# Patient Record
Sex: Female | Born: 1975
Health system: Southern US, Community
[De-identification: ages and names within clinical notes are randomized; demographics above are authoritative.]

## PROBLEM LIST (undated history)

## (undated) DIAGNOSIS — I10 Essential (primary) hypertension: Secondary | ICD-10-CM

## (undated) DIAGNOSIS — K219 Gastro-esophageal reflux disease without esophagitis: Secondary | ICD-10-CM

## (undated) DIAGNOSIS — F419 Anxiety disorder, unspecified: Secondary | ICD-10-CM

## (undated) HISTORY — DX: Anxiety disorder, unspecified: F41.9

## (undated) HISTORY — PX: REDUCTION MAMMAPLASTY: SUR839

## (undated) HISTORY — PX: BREAST REDUCTION SURGERY: SHX8

## (undated) HISTORY — PX: ABDOMINAL HYSTERECTOMY: SHX81

## (undated) HISTORY — DX: Gastro-esophageal reflux disease without esophagitis: K21.9

---

## 2004-09-01 HISTORY — PX: TUBAL LIGATION: SHX77

## 2004-10-03 ENCOUNTER — Ambulatory Visit: Payer: Self-pay | Admitting: Obstetrics & Gynecology

## 2004-12-19 ENCOUNTER — Inpatient Hospital Stay: Payer: Self-pay | Admitting: Unknown Physician Specialty

## 2007-03-04 ENCOUNTER — Ambulatory Visit: Payer: Self-pay

## 2007-08-02 ENCOUNTER — Ambulatory Visit: Payer: Self-pay | Admitting: General Surgery

## 2008-03-07 ENCOUNTER — Ambulatory Visit: Payer: Self-pay | Admitting: General Surgery

## 2009-05-02 ENCOUNTER — Ambulatory Visit: Payer: Self-pay | Admitting: General Surgery

## 2010-09-01 HISTORY — PX: BREAST SURGERY: SHX581

## 2011-09-02 HISTORY — PX: ABLATION: SHX5711

## 2012-12-02 ENCOUNTER — Ambulatory Visit: Payer: Self-pay | Admitting: Obstetrics & Gynecology

## 2012-12-02 LAB — CBC
HCT: 39.2 % (ref 35.0–47.0)
HGB: 12.8 g/dL (ref 12.0–16.0)
MCH: 27.6 pg (ref 26.0–34.0)
MCHC: 32.5 g/dL (ref 32.0–36.0)
MCV: 85 fL (ref 80–100)
Platelet: 251 10*3/uL (ref 150–440)
RBC: 4.63 10*6/uL (ref 3.80–5.20)
RDW: 13.6 % (ref 11.5–14.5)
WBC: 8.7 10*3/uL (ref 3.6–11.0)

## 2012-12-02 LAB — PREGNANCY, URINE: Pregnancy Test, Urine: NEGATIVE m[IU]/mL

## 2012-12-10 ENCOUNTER — Ambulatory Visit: Payer: Self-pay | Admitting: Obstetrics & Gynecology

## 2012-12-13 LAB — PATHOLOGY REPORT

## 2014-12-22 NOTE — Op Note (Signed)
PATIENT NAME:  Amy Day, KUBLY B MR#:  [id number] DATE OF BIRTH:  01-Dec-1975  DATE OF PROCEDURE:  12/10/2012  PREOPERATIVE DIAGNOSIS: Menorrhagia.   POSTOPERATIVE DIAGNOSIS: Menorrhagia.   PROCEDURE: 1.  Hysteroscopy. 2.  Dilation and curettage.   3.  NovaSure endometrial ablation general.   ANESTHESIA:  General.  SURGEON:  Nathan Moctezuma K. Thurmond Butts,  MD  ESTIMATED BLOOD LOSS: Minimal.   IV FLUIDS: 700 mL crystalloid.   URINE OUTPUT: 50 mL prior to procedure.   COMPLICATIONS: None.   FINDINGS: Small anteverted uterus, normal endometrium without defects, fibroids or polyps.  Uterus sounded to 9 cm, cervical length 5 cm, uterine cavity length 4 cm and width 3.8 cm NovaSure power 84 and run time 1 minute and 54 seconds.   SPECIMEN: Endometrial curettings.   PROCEDURE IN DETAIL: The patient was taken to the operating room where she was given general anesthesia without complication. She had her legs placed in candy cane stirrups. She was prepped and draped in standard sterile fashion. After a brief time now, speculum was inserted into the vagina and the cervix was visualized and grasped anteriorly with a single-tooth tenaculum. The uterus was then sounded. Next serially dilated up to 23 [place] and the hysteroscope was inserted into the uterus with findings noted above. Hysteroscope was then removed and sharp curettage was performed with acquisition of endometrial curettings which were sent for pathology. After full curettage with good uterine cry noted, the NovaSure was inserted into the uterus without difficulty with the settings noted above. A test was done and no complications detected though so the procedure was run for a total of 1 minute and 54 seconds. The NovaSure was then removed and noted to be intact. Next, the hysteroscope was again inserted one last time with visualization of the entire uterine cavity with good ablative visualization. 100% of the cavity had been ablated.  The  hysteroscope was removed, the single-tooth tenaculum removed. Hemostasis was noted to be excellent.  The speculum was removed. The patient tolerated the procedure well and was taken to the recovery room in stable condition.     ____________________________ Elinor Parkinson Thurmond Butts, MD eks:ct D: 12/10/2012 09:34:27 ET T: 12/10/2012 10:18:02 ET JOB#: [id number]  cc: Aron Baba K. Thurmond Butts, MD, <Dictator> Aron Baba Raeford Razor CLIPP MD ELECTRONICALLY SIGNED 12/11/2012 8:25

## 2015-06-05 ENCOUNTER — Other Ambulatory Visit: Payer: Self-pay | Admitting: Nurse Practitioner

## 2015-06-05 DIAGNOSIS — Z0189 Encounter for other specified special examinations: Secondary | ICD-10-CM

## 2015-06-05 DIAGNOSIS — Z1321 Encounter for screening for nutritional disorder: Secondary | ICD-10-CM

## 2015-06-05 DIAGNOSIS — E785 Hyperlipidemia, unspecified: Secondary | ICD-10-CM

## 2015-06-07 ENCOUNTER — Other Ambulatory Visit: Payer: Self-pay | Admitting: Nurse Practitioner

## 2015-06-07 ENCOUNTER — Other Ambulatory Visit: Payer: Self-pay | Admitting: *Deleted

## 2015-06-07 DIAGNOSIS — E785 Hyperlipidemia, unspecified: Secondary | ICD-10-CM

## 2015-06-07 DIAGNOSIS — Z1321 Encounter for screening for nutritional disorder: Secondary | ICD-10-CM

## 2015-06-07 DIAGNOSIS — Z0189 Encounter for other specified special examinations: Secondary | ICD-10-CM

## 2015-06-07 LAB — COMPREHENSIVE METABOLIC PANEL
ALT: 18 U/L (ref 0–35)
AST: 19 U/L (ref 0–37)
Albumin: 4 g/dL (ref 3.5–5.2)
Alkaline Phosphatase: 88 U/L (ref 39–117)
BUN: 14 mg/dL (ref 6–23)
CO2: 25 mEq/L (ref 19–32)
Calcium: 9.4 mg/dL (ref 8.4–10.5)
Chloride: 106 mEq/L (ref 96–112)
Creatinine, Ser: 0.83 mg/dL (ref 0.40–1.20)
GFR: 81.41 mL/min (ref >60.00)
Glucose, Bld: 87 mg/dL (ref 70–99)
Potassium: 4 mEq/L (ref 3.5–5.1)
Sodium: 138 mEq/L (ref 135–145)
Total Bilirubin: 0.5 mg/dL (ref 0.2–1.2)
Total Protein: 6.9 g/dL (ref 6.0–8.3)

## 2015-06-07 LAB — VITAMIN D 25 HYDROXY (VIT D DEFICIENCY, FRACTURES): VITD: 32.65 ng/mL (ref 30.00–100.00)

## 2015-06-08 ENCOUNTER — Telehealth: Payer: Self-pay | Admitting: *Deleted

## 2015-06-08 NOTE — Telephone Encounter (Signed)
Sherry from [facility] called saying the no longer do the NMR, lipoprofile but they have a very similar test called: Cardio IQ advance lipid panel is it ok to due? Please let me know

## 2015-06-08 NOTE — Telephone Encounter (Signed)
Sure

## 2015-06-11 LAB — CARDIO IQ(R) ADVANCED LIPID PANEL
Apolipoprotein B: 96 mg/dL (ref 49–103)
Cholesterol, Total: 185 mg/dL (ref 125–200)
Cholesterol/HDL Ratio: 3.1 calc (ref <=5.0)
HDL Cholesterol: 59 mg/dL (ref >=46)
LDL Large: 4594 nmol/L — ABNORMAL LOW (ref 5038–17886)
LDL Medium: 265 nmol/L (ref 121–397)
LDL Particle Number: 1283 nmol/L (ref 1016–2185)
LDL Peak Size: 221.7 Angstrom (ref >=218.2)
LDL Small: 194 nmol/L (ref 115–386)
LDL, Calculated: 109 mg/dL
Lipoprotein (a): 289 nmol/L — ABNORMAL HIGH (ref <75)
Non-HDL Cholesterol: 126 mg/dL
Triglycerides: 85 mg/dL

## 2015-06-11 LAB — HM PAP SMEAR: HM Pap smear: NORMAL

## 2015-06-12 ENCOUNTER — Encounter: Payer: Self-pay | Admitting: Nurse Practitioner

## 2015-06-27 ENCOUNTER — Encounter: Payer: Self-pay | Admitting: Nurse Practitioner

## 2015-06-27 ENCOUNTER — Ambulatory Visit (INDEPENDENT_AMBULATORY_CARE_PROVIDER_SITE_OTHER): Payer: 59 | Admitting: Nurse Practitioner

## 2015-06-27 VITALS — BP 114/80 | HR 80 | Temp 98.3°F | Resp 14 | Ht 67.0 in | Wt 215.2 lb

## 2015-06-27 DIAGNOSIS — I10 Essential (primary) hypertension: Secondary | ICD-10-CM | POA: Diagnosis not present

## 2015-06-27 DIAGNOSIS — E66811 Obesity, class 1: Secondary | ICD-10-CM | POA: Insufficient documentation

## 2015-06-27 DIAGNOSIS — K219 Gastro-esophageal reflux disease without esophagitis: Secondary | ICD-10-CM | POA: Diagnosis not present

## 2015-06-27 DIAGNOSIS — E66812 Obesity, class 2: Secondary | ICD-10-CM | POA: Insufficient documentation

## 2015-06-27 DIAGNOSIS — Z7689 Persons encountering health services in other specified circumstances: Secondary | ICD-10-CM | POA: Insufficient documentation

## 2015-06-27 DIAGNOSIS — Z79899 Other long term (current) drug therapy: Secondary | ICD-10-CM | POA: Insufficient documentation

## 2015-06-27 DIAGNOSIS — E669 Obesity, unspecified: Secondary | ICD-10-CM

## 2015-06-27 DIAGNOSIS — Z1159 Encounter for screening for other viral diseases: Secondary | ICD-10-CM | POA: Insufficient documentation

## 2015-06-27 MED ORDER — INSULIN PEN NEEDLE 31G X 5 MM MISC: 0.6000 mg | Freq: Every day | Status: DC

## 2015-06-27 MED ORDER — LIRAGLUTIDE -WEIGHT MANAGEMENT 18 MG/3ML ~~LOC~~ SOPN: 0.6000 mg | PEN_INJECTOR | Freq: Every day | SUBCUTANEOUS | Status: DC

## 2015-06-27 NOTE — Assessment & Plan Note (Signed)
Pt has food triggers and uses Prilosec daily.

## 2015-06-27 NOTE — Progress Notes (Signed)
Pre visit review using our clinic review tool, if applicable. No additional management support is needed unless otherwise documented below in the visit note. 

## 2015-06-27 NOTE — Assessment & Plan Note (Signed)
Discussed acute and chronic issues. Reviewed health maintenance measures, PFSHx, and immunizations. Obtain records.   

## 2015-06-27 NOTE — Assessment & Plan Note (Signed)
>>  ASSESSMENT AND PLAN FOR OBESITY WRITTEN ON 06/27/2015  3:44 PM BY DOSS, CARRIE M, NP  Wt Readings from Last 3 Encounters:  06/27/15 215 lb 3.2 oz (97.614 kg)   Pt would like to try Saxenda .  She has failed phentermine and weight watchers in the past.  Follow up when she has the device.

## 2015-06-27 NOTE — Progress Notes (Signed)
Patient ID: Amy Day, female    DOB: [birth year]  Age: 39 y.o. MRN: [id number]  CC: Establish Care   HPI Amy Day presents for establishing care today.   1) New pt info:   Immunizations- UTD   Mammogram- Not of age, but has grandmother dx approx age 68   Pap- 78/2956 Amy Day for GYN care   Eye Exam- Not UTD  Dental- UTD  LMP- 4 years ago   2) Chronic Problems-   GERD- watches triggers, prilosec  HTN- Cozaar   Ibuprofen use often   3) Acute Problems-  Weight loss- phentermine helped the first time, lost a good amount of weight did not help the second time (lost 3 lbs). Weight watchers helped to a certain point, but once she stops the lifestyle changes she gained back steadily.   History Amy Day has a past medical history of GERD (gastroesophageal reflux disease).   She has past surgical history that includes Tubal ligation (2006); Breast surgery (2012); and Ablation (2013).   Her family history includes COPD in her paternal grandmother; Diabetes in her father.She reports that she quit smoking about 11 years ago. She does not have any smokeless tobacco history on file. She reports that she drinks alcohol. She reports that she does not use illicit drugs.  No outpatient prescriptions prior to visit.   No facility-administered medications prior to visit.    ROS Review of Systems  Constitutional: Negative for fever, chills, diaphoresis, activity change, appetite change, fatigue and unexpected weight change.  Respiratory: Negative for chest tightness, shortness of breath and wheezing.   Cardiovascular: Negative for chest pain, palpitations and leg swelling.  Gastrointestinal: Negative for nausea, vomiting and diarrhea.  Skin: Negative for rash.  Neurological: Negative for dizziness, weakness, numbness and headaches.  Psychiatric/Behavioral: Negative for suicidal ideas and sleep disturbance. The patient is not nervous/anxious.     Objective:  BP 114/80 mmHg   Pulse 80  Temp(Src) 98.3 F (36.8 C)  Resp 14  Ht 5\' 7"  (1.702 m)  Wt 215 lb 3.2 oz (97.614 kg)  BMI 33.70 kg/m2  SpO2 97%  Physical Exam  Constitutional: She is oriented to person, place, and time. She appears well-developed and well-nourished. No distress.  HENT:  Head: Normocephalic and atraumatic.  Right Ear: External ear normal.  Left Ear: External ear normal.  Neurological: She is alert and oriented to person, place, and time. No cranial nerve deficit. She exhibits normal muscle tone. Coordination normal.  Skin: Skin is warm and dry. No rash noted. She is not diaphoretic.  Psychiatric: She has a normal mood and affect. Her behavior is normal. Judgment and thought content normal.   Assessment & Plan:   Amy Day was seen today for establish care.  Diagnoses and all orders for this visit:  Encounter to establish care  Obesity -     Liraglutide -Weight Management (SAXENDA) 18 MG/3ML SOPN; Inject 0.6 mg into the skin daily.  I am having Amy Day start on Liraglutide -Weight Management. I am also having her maintain her losartan and omeprazole.  Meds ordered this encounter  Medications  . losartan (COZAAR) 50 MG tablet    Sig: Take 1 tablet by mouth daily.  Marland Kitchen omeprazole (PRILOSEC) 40 MG capsule    Sig: Take 1 capsule by mouth daily.  . Liraglutide -Weight Management (SAXENDA) 18 MG/3ML SOPN    Sig: Inject 0.6 mg into the skin daily.    Dispense:  3 mL    Refill:  0  Order Specific Question:  Supervising Provider    Answer:  Amy Day [[id number]]     Follow-up: No Follow-up on file.

## 2015-06-27 NOTE — Assessment & Plan Note (Signed)
BP Readings from Last 3 Encounters:  06/27/15 114/80   Pt stable on Cozaar.

## 2015-06-27 NOTE — Assessment & Plan Note (Addendum)
Wt Readings from Last 3 Encounters:  06/27/15 215 lb 3.2 oz (97.614 kg)   Pt would like to try Saxenda.  She has failed phentermine and weight watchers in the past.  Follow up when she has the device.

## 2015-06-28 ENCOUNTER — Telehealth: Payer: Self-pay

## 2015-06-28 NOTE — Telephone Encounter (Signed)
Started PA process in cover my meds for Saxenda.

## 2015-07-02 ENCOUNTER — Ambulatory Visit: Payer: Self-pay | Admitting: Nurse Practitioner

## 2015-07-04 NOTE — Telephone Encounter (Signed)
Message from cover my meds, drug approved.

## 2015-07-06 ENCOUNTER — Other Ambulatory Visit: Payer: Self-pay | Admitting: Nurse Practitioner

## 2015-07-06 MED ORDER — HYDROCOD POLST-CPM POLST ER 10-8 MG/5ML PO SUER: 5.0000 mL | Freq: Every evening | ORAL | Status: DC | PRN

## 2015-08-14 ENCOUNTER — Other Ambulatory Visit: Payer: Self-pay | Admitting: Nurse Practitioner

## 2015-08-14 DIAGNOSIS — E669 Obesity, unspecified: Secondary | ICD-10-CM

## 2015-08-14 MED ORDER — LIRAGLUTIDE -WEIGHT MANAGEMENT 18 MG/3ML ~~LOC~~ SOPN: 0.6000 mg | PEN_INJECTOR | Freq: Every day | SUBCUTANEOUS | Status: DC

## 2015-10-05 NOTE — Telephone Encounter (Signed)
Submitted for re authorization of saxenda on Cover my meds, 10/05/15

## 2015-10-17 ENCOUNTER — Telehealth: Payer: Self-pay

## 2015-10-17 DIAGNOSIS — R58 Hemorrhage, not elsewhere classified: Secondary | ICD-10-CM | POA: Diagnosis not present

## 2015-10-17 DIAGNOSIS — N809 Endometriosis, unspecified: Secondary | ICD-10-CM | POA: Diagnosis not present

## 2015-10-17 NOTE — Telephone Encounter (Signed)
Re approval for Saxenda completed, approved from 10/19/2015-10/18/2016.

## 2015-11-06 DIAGNOSIS — R102 Pelvic and perineal pain: Secondary | ICD-10-CM | POA: Diagnosis not present

## 2015-11-19 DIAGNOSIS — R1031 Right lower quadrant pain: Secondary | ICD-10-CM | POA: Diagnosis not present

## 2015-11-19 DIAGNOSIS — R102 Pelvic and perineal pain: Secondary | ICD-10-CM | POA: Diagnosis not present

## 2015-12-11 ENCOUNTER — Ambulatory Visit (INDEPENDENT_AMBULATORY_CARE_PROVIDER_SITE_OTHER): Payer: 59 | Admitting: Family Medicine

## 2015-12-11 ENCOUNTER — Encounter: Payer: Self-pay | Admitting: Family Medicine

## 2015-12-11 VITALS — BP 136/90 | HR 91 | Temp 98.2°F | Ht 67.0 in | Wt 215.0 lb

## 2015-12-11 DIAGNOSIS — R002 Palpitations: Secondary | ICD-10-CM | POA: Diagnosis not present

## 2015-12-11 LAB — COMPREHENSIVE METABOLIC PANEL
ALT: 12 U/L (ref 0–35)
AST: 12 U/L (ref 0–37)
Albumin: 4.2 g/dL (ref 3.5–5.2)
Alkaline Phosphatase: 76 U/L (ref 39–117)
BUN: 12 mg/dL (ref 6–23)
CO2: 23 mEq/L (ref 19–32)
Calcium: 9.3 mg/dL (ref 8.4–10.5)
Chloride: 107 mEq/L (ref 96–112)
Creatinine, Ser: 0.91 mg/dL (ref 0.40–1.20)
GFR: 73.02 mL/min (ref >60.00)
Glucose, Bld: 129 mg/dL — ABNORMAL HIGH (ref 70–99)
Potassium: 4 mEq/L (ref 3.5–5.1)
Sodium: 141 mEq/L (ref 135–145)
Total Bilirubin: 0.4 mg/dL (ref 0.2–1.2)
Total Protein: 6.9 g/dL (ref 6.0–8.3)

## 2015-12-11 LAB — CBC
HCT: 43.3 % (ref 36.0–46.0)
Hemoglobin: 14.8 g/dL (ref 12.0–15.0)
MCHC: 34.1 g/dL (ref 30.0–36.0)
MCV: 91.3 fl (ref 78.0–100.0)
Platelets: 323 10*3/uL (ref 150.0–400.0)
RBC: 4.74 Mil/uL (ref 3.87–5.11)
RDW: 12.6 % (ref 11.5–15.5)
WBC: 8.3 10*3/uL (ref 4.0–10.5)

## 2015-12-11 LAB — TSH: TSH: 1.66 u[IU]/mL (ref 0.35–4.50)

## 2015-12-11 NOTE — Assessment & Plan Note (Signed)
Patient presents with increased frequency and palpitations. EKG is reassuring today. No current symptoms. No chest pain or shortness of breath with this. Vital signs are reassuring. Blood pressure is slightly elevated though patient reports she has not been taking her blood pressure medication. She reports it is in the "normal range" at home. Advised that she should continue to monitor blood pressure. If greater than 140/90 she needs to let us know so we can restart her blood pressure medication. We'll check lab work as outlined below to evaluate her palpitations. We will refer her to cardiology for further evaluation given increased frequency. She is given return precautions.

## 2015-12-11 NOTE — Patient Instructions (Signed)
Nice to see you. We're going to check some lab work to evaluate your palpitations. We are going to refer you to cardiology for further evaluation of this. Please monitor your blood pressure at home. If it is consistently greater than 140/90 please let us know so we can restart your blood pressure medication. If you develop persistent palpitations, or develop chest pain, shortness of breath, sweating, or any new or changing symptoms please seek medical attention.

## 2015-12-11 NOTE — Progress Notes (Signed)
Patient ID: [name], female   DOB: 07/26/1976, 40 y.o.   MRN: [id number]  Amy Rumps, MD Phone: [phone]  Amy Day is a 40 y.o. female who presents today for same-day visit.  Palpitations: Patient notes over the last several days she has felt like her heart has intermittently been racing. Feels as though it is startled and flutters for a couple of seconds and then improves. She has had this in the past though not as frequently. Is occurring up to 6 times per day. She denies chest pain and shortness of breath with this. No history of thyroid dysfunction, anemia, or electrolyte abnormalities. She has not taking her blood pressure medicine. She denies excessive intake of caffeine. And no new medications.  PMH: Former smoker   ROS see history of present illness  Objective  Physical Exam Filed Vitals:   12/11/15 0906 12/11/15 0920  BP: 144/92 136/90  Pulse: 91   Temp: 98.2 F (36.8 C)     BP Readings from Last 3 Encounters:  12/11/15 136/90  06/27/15 114/80   Wt Readings from Last 3 Encounters:  12/11/15 215 lb (97.523 kg)  06/27/15 215 lb 3.2 oz (97.614 kg)    Physical Exam  Constitutional: She is well-developed, well-nourished, and in no distress.  HENT:  Head: Normocephalic and atraumatic.  Neck: Neck supple. No thyromegaly present.  Cardiovascular: Normal rate, regular rhythm and normal heart sounds.  Exam reveals no gallop and no friction rub.   No murmur heard. Pulmonary/Chest: Effort normal and breath sounds normal. No respiratory distress. She has no wheezes. She has no rales.  Musculoskeletal: She exhibits no edema.  Neurological: She is alert. Gait normal.  Skin: Skin is warm and dry. She is not diaphoretic.   EKG: Normal sinus rhythm, rate 86, no ST or T-wave changes  Assessment/Plan: Please see individual problem list.  Palpitations Patient presents with increased frequency and palpitations. EKG is reassuring today. No current symptoms. No  chest pain or shortness of breath with this. Vital signs are reassuring. Blood pressure is slightly elevated though patient reports she has not been taking her blood pressure medication. She reports it is in the "normal range" at home. Advised that she should continue to monitor blood pressure. If greater than 140/90 she needs to let us know so we can restart her blood pressure medication. We'll check lab work as outlined below to evaluate her palpitations. We will refer her to cardiology for further evaluation given increased frequency. She is given return precautions.    Orders Placed This Encounter  Procedures  . CBC  . TSH  . Comp Met (CMET)  . Ambulatory referral to Cardiology    Referral Priority:  Routine    Referral Type:  Consultation    Referral Reason:  Specialty Services Required    Requested Specialty:  Cardiology    Number of Visits Requested:  1  . EKG 12-Lead    Amy Rumps, MD [place]

## 2015-12-11 NOTE — Progress Notes (Signed)
Pre visit review using our clinic review tool, if applicable. No additional management support is needed unless otherwise documented below in the visit note. 

## 2015-12-12 ENCOUNTER — Ambulatory Visit (INDEPENDENT_AMBULATORY_CARE_PROVIDER_SITE_OTHER): Payer: 59 | Admitting: Cardiology

## 2015-12-12 ENCOUNTER — Encounter: Payer: Self-pay | Admitting: Cardiology

## 2015-12-12 VITALS — BP 142/98 | HR 80 | Ht 66.0 in | Wt 211.5 lb

## 2015-12-12 DIAGNOSIS — R002 Palpitations: Secondary | ICD-10-CM | POA: Diagnosis not present

## 2015-12-12 DIAGNOSIS — R030 Elevated blood-pressure reading, without diagnosis of hypertension: Secondary | ICD-10-CM

## 2015-12-12 DIAGNOSIS — IMO0001 Reserved for inherently not codable concepts without codable children: Secondary | ICD-10-CM

## 2015-12-12 NOTE — Progress Notes (Signed)
Cardiology Office Note   Date:  12/12/2015   ID:  Amy Day, DOB 23-Feb-1976, MRN [id number]  Referring Doctor:  Rubbie Battiest, NP   Cardiologist:   Wende Bushy, MD   Reason for consultation:  Chief Complaint  Patient presents with  . other    Ref by Dr. Flonnie Overman for palpitations. Meds reviewed by the patient verbally.       History of Present Illness: Amy Day is a 40 y.o. female who presents for Palpitations. Episodes started over the weekend, approximately 4 days ago. Palpitations are described as fluttering/pounding in the chest. Patient feels that her heart beats or more noticeable. She doesn't feel them racing. Symptoms are located mainly in the chest, nonradiating. Mild to moderate in severity, lasting a few seconds at a time. Episodes occur throughout the day, randomly occurring, not associated with anything in particular, not more noticeable with exertion. May be more noticeable at rest. No associated chest pain or shortness of breath. She does not recall anything in particular that's changed in her routine over the weekend. No excessive caffeine intake.  Patient denies headache, fever, cough, colds, shortness of breath, abdominal pain, chest pain PND, orthopnea, edema.   ROS:  Please see the history of present illness. Aside from mentioned under HPI, all other systems are reviewed and negative.     Past Medical History  Diagnosis Date  . GERD (gastroesophageal reflux disease)     Past Surgical History  Procedure Laterality Date  . Tubal ligation  2006  . Breast surgery  2012    Breast Reduction  . Ablation  2013     reports that she quit smoking about 11 years ago. She does not have any smokeless tobacco history on file. She reports that she drinks alcohol. She reports that she does not use illicit drugs. Patient uses electronic cigarettes/vaping  family history includes COPD in her paternal grandmother; Diabetes in her father.  Father had MI at age  39. Paternal grandmother had "angina".  Current Outpatient Prescriptions  Medication Sig Dispense Refill  . norethindrone (AYGESTIN) 5 MG tablet Take 15 mg by mouth daily.    Marland Kitchen omeprazole (PRILOSEC) 40 MG capsule Take 1 capsule by mouth daily.     No current facility-administered medications for this visit.    Allergies: Review of patient's allergies indicates no known allergies.    PHYSICAL EXAM: VS:  BP 142/98 mmHg  Pulse 80  Ht 5\' 6"  (1.676 m)  Wt 211 lb 8 oz (95.936 kg)  BMI 34.15 kg/m2  SpO2 98% , Body mass index is 34.15 kg/(m^2). Wt Readings from Last 3 Encounters:  12/12/15 211 lb 8 oz (95.936 kg)  12/11/15 215 lb (97.523 kg)  06/27/15 215 lb 3.2 oz (97.614 kg)    GENERAL:  well developed, well nourished, obese, not in acute distress HEENT: normocephalic, pink conjunctivae, anicteric sclerae, no xanthelasma, normal dentition, oropharynx clear NECK:  no neck vein engorgement, JVP normal, no hepatojugular reflux, carotid upstroke brisk and symmetric, no bruit, no thyromegaly, no lymphadenopathy LUNGS:  good respiratory effort, clear to auscultation bilaterally CV:  PMI not displaced, no thrills, no lifts, S1 and S2 within normal limits, no palpable S3 or S4, no murmurs, no rubs, no gallops ABD:  Soft, nontender, nondistended, normoactive bowel sounds, no abdominal aortic bruit, no hepatomegaly, no splenomegaly MS: nontender back, no kyphosis, no scoliosis, no joint deformities EXT:  2+ DP/PT pulses, no edema, no varicosities, no cyanosis, no clubbing SKIN: warm,  nondiaphoretic, normal turgor, no ulcers NEUROPSYCH: alert, oriented to person, place, and time, sensory/motor grossly intact, normal mood, appropriate affect  Recent Labs: 12/11/2015: ALT 12; BUN 12; Creatinine, Ser 0.91; Hemoglobin 14.8; Platelets 323.0; Potassium 4.0; Sodium 141; TSH 1.66   Lipid Panel    Component Value Date/Time   CHOL 185 06/07/2015 0821   TRIG 85 06/07/2015 0821   HDL 59 06/07/2015 0821    CHOLHDL 3.1 06/07/2015 0821   LDLCALC 109 06/07/2015 0821     Other studies Reviewed:  EKG:  The ekg from 12/12/2015 was personally reviewed by me and it revealed sinus rhythm, 80 BPM.  Additional studies/ records that were reviewed personally reviewed by me today include: None available   ASSESSMENT AND PLAN:  Palpitations Recommend 24-hour Holter monitor, and echocardiogram for further evaluation.  Elevated blood pressure measurement Patient reports that she used to take losartan for diagnosis of hypertension. She stopped taking this last 6 months since she noted her blood pressure to be within normal limits. Recommend blood pressure log and to check with PCP regarding this. She may need to go back on her antihypertensive medication.  Advised patient to check with prescribing M.D. regarding use of OCP and e-garettes/vaping.   Current medicines are reviewed at length with the patient today.  The patient does not have concerns regarding medicines.  Labs/ tests ordered today include:  Orders Placed This Encounter  Procedures  . Holter monitor - 24 hour  . EKG 12-Lead  . Echocardiogram    I had a lengthy and detailed discussion with the patient regarding diagnoses, prognosis, diagnostic options, treatment options.   I counseled the patient on importance of lifestyle modification including heart healthy diet, regular physical activity .   Disposition:   FU with undersigned after tests    Signed, Wende Bushy, MD  12/12/2015 10:13 AM    [place]

## 2015-12-12 NOTE — Patient Instructions (Addendum)
Medication Instructions:  Your physician has recommended you make the following change in your medication: Check blood pressures and resume medications previously ordered as directed by Dr. Yvone Neu.   Worthy Keeler: None ordered  Testing/Procedures: Your physician has requested that you have an echocardiogram. Echocardiography is a painless test that uses sound waves to create images of your heart. It provides your doctor with information about the size and shape of your heart and how well your heart's chambers and valves are working. This procedure takes approximately one hour. There are no restrictions for this procedure.  Date & Time:______________________________________________________  Your physician has recommended that you wear a holter monitor. Holter monitors are medical devices that record the heart's electrical activity. Doctors most often use these monitors to diagnose arrhythmias. Arrhythmias are problems with the speed or rhythm of the heartbeat. The monitor is a small, portable device. You can wear one while you do your normal daily activities. This is usually used to diagnose what is causing palpitations/syncope (passing out).  Date & Time:_________________________________________________________  Follow-Up: Your physician recommends that you schedule a follow-up appointment after testing to review results.  Date & Time:___________________________________________________________   Any Other Special Instructions Will Be Listed Below (If Applicable).  Your physician has requested that you regularly monitor and record your blood pressure readings at home. Please use the same machine at the same time of day to check your readings and record them to bring to your follow-up visit.  Keep log and bring to next visit.    If you need a refill on your cardiac medications before your next appointment, please call your pharmacy.  Echocardiogram An echocardiogram, or echocardiography,  uses sound waves (ultrasound) to produce an image of your heart. The echocardiogram is simple, painless, obtained within a short period of time, and offers valuable information to your health care provider. The images from an echocardiogram can provide information such as:  Evidence of coronary artery disease (CAD).  Heart size.  Heart muscle function.  Heart valve function.  Aneurysm detection.  Evidence of a past heart attack.  Fluid buildup around the heart.  Heart muscle thickening.  Assess heart valve function. LET [facility] CARE PROVIDER KNOW ABOUT:  Any allergies you have.  All medicines you are taking, including vitamins, herbs, eye drops, creams, and over-the-counter medicines.  Previous problems you or members of your family have had with the use of anesthetics.  Any blood disorders you have.  Previous surgeries you have had.  Medical conditions you have.  Possibility of pregnancy, if this applies. BEFORE THE PROCEDURE  No special preparation is needed. Eat and drink normally.  PROCEDURE   In order to produce an image of your heart, gel will be applied to your chest and a wand-like tool (transducer) will be moved over your chest. The gel will help transmit the sound waves from the transducer. The sound waves will harmlessly bounce off your heart to allow the heart images to be captured in real-time motion. These images will then be recorded.  You may need an IV to receive a medicine that improves the quality of the pictures. AFTER THE PROCEDURE You may return to your normal schedule including diet, activities, and medicines, unless your health care provider tells you otherwise.   This information is not intended to replace advice given to you by your health care provider. Make sure you discuss any questions you have with your health care provider.   Document Released: 08/15/2000 Document Revised: 09/08/2014 Document Reviewed: 04/25/2013 Elsevier  Interactive  Patient Education 2016 [place].    Holter Monitoring A Holter monitor is a small device that is used to detect abnormal heart rhythms. It clips to your clothing and is connected by wires to flat, sticky disks (electrodes) that attach to your chest. It is worn continuously for 24-48 hours. HOME CARE INSTRUCTIONS  Wear your Holter monitor at all times, even while exercising and sleeping, for as long as directed by your health care provider.  Make sure that the Holter monitor is safely clipped to your clothing or close to your body as recommended by your health care provider.  Do not get the monitor or wires wet.  Do not put body lotion or moisturizer on your chest.  Keep your skin clean.  Keep a diary of your daily activities, such as walking and doing chores. If you feel that your heartbeat is abnormal or that your heart is fluttering or skipping a beat:  Record what you are doing when it happens.  Record what time of day the symptoms occur.  Return your Holter monitor as directed by your health care provider.  Keep all follow-up visits as directed by your health care provider. This is important. SEEK IMMEDIATE MEDICAL CARE IF:  You feel lightheaded or you faint.  You have trouble breathing.  You feel pain in your chest, upper arm, or jaw.  You feel sick to your stomach and your skin is pale, cool, or damp.  You heartbeat feels unusual or abnormal.   This information is not intended to replace advice given to you by your health care provider. Make sure you discuss any questions you have with your health care provider.   Document Released: 05/16/2004 Document Revised: 09/08/2014 Document Reviewed: 03/27/2014 Elsevier Interactive Patient Education [employer].

## 2015-12-17 ENCOUNTER — Other Ambulatory Visit: Payer: Self-pay | Admitting: Nurse Practitioner

## 2015-12-17 DIAGNOSIS — R739 Hyperglycemia, unspecified: Secondary | ICD-10-CM

## 2015-12-17 DIAGNOSIS — R7309 Other abnormal glucose: Secondary | ICD-10-CM

## 2015-12-27 ENCOUNTER — Ambulatory Visit (INDEPENDENT_AMBULATORY_CARE_PROVIDER_SITE_OTHER): Payer: 59

## 2015-12-27 ENCOUNTER — Other Ambulatory Visit: Payer: Self-pay

## 2015-12-27 DIAGNOSIS — R002 Palpitations: Secondary | ICD-10-CM

## 2015-12-31 ENCOUNTER — Ambulatory Visit: Payer: 59 | Attending: Cardiology

## 2016-01-03 ENCOUNTER — Other Ambulatory Visit: Payer: 59

## 2016-01-04 ENCOUNTER — Other Ambulatory Visit (INDEPENDENT_AMBULATORY_CARE_PROVIDER_SITE_OTHER): Payer: 59

## 2016-01-04 DIAGNOSIS — R7309 Other abnormal glucose: Secondary | ICD-10-CM

## 2016-01-04 LAB — GLUCOSE, RANDOM: Glucose, Bld: 97 mg/dL (ref 70–99)

## 2016-01-04 LAB — HEMOGLOBIN A1C: Hgb A1c MFr Bld: 5.7 % (ref 4.6–6.5)

## 2016-01-08 ENCOUNTER — Telehealth: Payer: Self-pay | Admitting: Cardiology

## 2016-01-08 NOTE — Telephone Encounter (Signed)
Called and spoke with patient regarding FMLA paperwork that we received for her. She states that we are to disregard that and it is not needed at this time. Let her know we would and to call back if I can assist her in any way. She verbalized understanding and had no further questions.

## 2016-01-10 ENCOUNTER — Encounter: Payer: Self-pay | Admitting: Cardiology

## 2016-01-10 ENCOUNTER — Ambulatory Visit (INDEPENDENT_AMBULATORY_CARE_PROVIDER_SITE_OTHER): Payer: 59 | Admitting: Cardiology

## 2016-01-10 VITALS — BP 104/76 | HR 88 | Ht 66.0 in | Wt 210.1 lb

## 2016-01-10 DIAGNOSIS — R002 Palpitations: Secondary | ICD-10-CM | POA: Diagnosis not present

## 2016-01-10 DIAGNOSIS — I10 Essential (primary) hypertension: Secondary | ICD-10-CM

## 2016-01-10 NOTE — Progress Notes (Signed)
Cardiology Office Note   Date:  01/10/2016   ID:  Amy Day, DOB 1976-07-28, MRN [id number]  Referring Doctor:  Rubbie Battiest, NP   Cardiologist:   Wende Bushy, MD   Reason for consultation:  Chief Complaint  Patient presents with  . Follow-up    NO CP, SOB OR SWELLING. PATIENT HAS NO OTHER COMPLAINTS.      History of Present Illness: Amy Day is a 40 y.o. female who presents for Palpitations,  After tests.   Since patient started taking her losartan again, which was prescribed by her doctor for hypertension, she is noted resolution of palpitations. She actually feels better. No chest pain. No shortness breath. Blood pressure at home highest that she was able to record was in the 120s over 80s.  Patient denies headache, fever, cough, colds, shortness of breath, abdominal pain, chest pain PND, orthopnea, edema.   ROS:  Please see the history of present illness. Aside from mentioned under HPI, all other systems are reviewed and negative.     Past Medical History  Diagnosis Date  . GERD (gastroesophageal reflux disease)     Past Surgical History  Procedure Laterality Date  . Tubal ligation  2006  . Breast surgery  2012    Breast Reduction  . Ablation  2013     reports that she quit smoking about 11 years ago. She does not have any smokeless tobacco history on file. She reports that she drinks alcohol. She reports that she does not use illicit drugs. Patient uses electronic cigarettes/vaping  family history includes COPD in her paternal grandmother; Diabetes in her father.  Father had MI at age 19. Paternal grandmother had "angina".  Current Outpatient Prescriptions  Medication Sig Dispense Refill  . losartan (COZAAR) 50 MG tablet Take 50 mg by mouth daily.    . norethindrone (AYGESTIN) 5 MG tablet Take 15 mg by mouth daily.    Marland Kitchen omeprazole (PRILOSEC) 40 MG capsule Take 1 capsule by mouth daily.     No current facility-administered medications for  this visit.    Allergies: Review of patient's allergies indicates no known allergies.    PHYSICAL EXAM: VS:  BP 104/76 mmHg  Pulse 88  Ht 5\' 6"  (1.676 m)  Wt 210 lb 1.9 oz (95.31 kg)  BMI 33.93 kg/m2 , Body mass index is 33.93 kg/(m^2). Wt Readings from Last 3 Encounters:  01/10/16 210 lb 1.9 oz (95.31 kg)  12/12/15 211 lb 8 oz (95.936 kg)  12/11/15 215 lb (97.523 kg)    GENERAL:  well developed, well nourished, obese, not in acute distress HEENT: normocephalic, pink conjunctivae, anicteric sclerae, no xanthelasma, normal dentition, oropharynx clear NECK:  no neck vein engorgement, JVP normal, no hepatojugular reflux, carotid upstroke brisk and symmetric, no bruit, no thyromegaly, no lymphadenopathy LUNGS:  good respiratory effort, clear to auscultation bilaterally CV:  PMI not displaced, no thrills, no lifts, S1 and S2 within normal limits, no palpable S3 or S4, no murmurs, no rubs, no gallops ABD:  Soft, nontender, nondistended, normoactive bowel sounds, no abdominal aortic bruit, no hepatomegaly, no splenomegaly MS: nontender back, no kyphosis, no scoliosis, no joint deformities EXT:  2+ DP/PT pulses, no edema, no varicosities, no cyanosis, no clubbing SKIN: warm, nondiaphoretic, normal turgor, no ulcers NEUROPSYCH: alert, oriented to person, place, and time, sensory/motor grossly intact, normal mood, appropriate affect  Recent Labs: 12/11/2015: ALT 12; BUN 12; Creatinine, Ser 0.91; Hemoglobin 14.8; Platelets 323.0; Potassium 4.0; Sodium 141; TSH  1.66   Lipid Panel    Component Value Date/Time   CHOL 185 06/07/2015 0821   TRIG 85 06/07/2015 0821   HDL 59 06/07/2015 0821   CHOLHDL 3.1 06/07/2015 0821   LDLCALC 109 06/07/2015 0821     Other studies Reviewed:  EKG:  The ekg from 12/12/2015 was personally reviewed by me and it revealed sinus rhythm, 80 BPM.  Additional studies/ records that were reviewed personally reviewed by me today include:   echocardiogram  12/27/2015: Left ventricle: The cavity size was normal. Systolic function was  normal. The estimated ejection fraction was in the range of 60%  to 65%. Wall motion was normal; there were no regional wall  motion abnormalities. Left ventricular diastolic function  parameters were normal. - Right ventricle: Systolic function was normal. - Pulmonary arteries: Systolic pressure was within the normal  range. Impressions: - Normal study.   Holter monitor  12/27/2015:  Sinus rhythm, average heart rate of 89 BPM.  No high grade supraventricular or ventricular ectopy. Overall rhythm sinus, average of 89 BPM. Minimum heart rate 55 BPM at 4:06 AM 12/20/2015. Maximum heart rate 138 bpm at 12:21 PM 12/27/2015. 34% of the total number of beats in tachycardia. No high grade supraventricular ectopy: 40 isolated PACs, 3 atrial couplets. Next No high-grade ventricular ectopy: 38 isolated PVCs, 2 ventricular couplets. No evidence of atrial fibrillation.  ASSESSMENT AND PLAN:  Palpitations 24-hour Holter monitor, and echocardiogram  Were unremarkable. No high grade supraventricular or ventricular ectopy on Holter monitor. LV systolic function was normal on echocardiogram. Discussed findings with patient. Patient reassured. Patient verbalized understanding.   Hypertension BP is well controlled. Continue monitoring BP. Continue current medical therapy and lifestyle changes. Continue losartan as prescribed by PCP.  Advised patient to check with prescribing M.D. regarding use of OCP and e-garettes/vaping.   Current medicines are reviewed at length with the patient today.  The patient does not have concerns regarding medicines.  Labs/ tests ordered today include:  No orders of the defined types were placed in this encounter.    I had a lengthy and detailed discussion with the patient regarding diagnoses, prognosis, diagnostic options, treatment options.   I counseled the patient on  importance of lifestyle modification including heart healthy diet, regular physical activity .   Disposition:   FU with undersigned  When necessary  Signed, Wende Bushy, MD  01/10/2016 10:06 AM    [place]

## 2016-01-10 NOTE — Patient Instructions (Signed)
Medication Instructions:  Your physician recommends that you continue on your current medications as directed. Please refer to the Current Medication list given to you today.  Labwork: None ordered.  Testing/Procedures: None ordered.  Follow-Up: Your physician recommends that you schedule a follow-up appointment as needed.   Any Other Special Instructions Will Be Listed Below (If Applicable).     If you need a refill on your cardiac medications before your next appointment, please call your pharmacy.   

## 2016-01-11 ENCOUNTER — Telehealth: Payer: Self-pay | Admitting: Cardiology

## 2016-01-11 NOTE — Telephone Encounter (Signed)
Received records request from [employer] , forwarded to [facility] for processing 01/11/16.

## 2016-01-18 DIAGNOSIS — R102 Pelvic and perineal pain: Secondary | ICD-10-CM | POA: Diagnosis not present

## 2016-03-06 ENCOUNTER — Other Ambulatory Visit: Payer: Self-pay | Admitting: Family Medicine

## 2016-03-06 DIAGNOSIS — M542 Cervicalgia: Secondary | ICD-10-CM

## 2016-03-07 ENCOUNTER — Ambulatory Visit (INDEPENDENT_AMBULATORY_CARE_PROVIDER_SITE_OTHER): Payer: 59

## 2016-03-07 DIAGNOSIS — M542 Cervicalgia: Secondary | ICD-10-CM

## 2016-03-07 DIAGNOSIS — M50323 Other cervical disc degeneration at C6-C7 level: Secondary | ICD-10-CM | POA: Diagnosis not present

## 2016-03-07 DIAGNOSIS — M50322 Other cervical disc degeneration at C5-C6 level: Secondary | ICD-10-CM | POA: Diagnosis not present

## 2016-03-10 ENCOUNTER — Other Ambulatory Visit: Payer: Self-pay | Admitting: Family Medicine

## 2016-03-10 MED ORDER — MELOXICAM 15 MG PO TABS: 15.0000 mg | ORAL_TABLET | Freq: Every day | ORAL | Status: DC | PRN

## 2016-08-08 ENCOUNTER — Other Ambulatory Visit: Payer: Self-pay | Admitting: Family Medicine

## 2016-08-08 MED ORDER — OMEPRAZOLE 40 MG PO CPDR: 40.0000 mg | DELAYED_RELEASE_CAPSULE | Freq: Every day | ORAL | 3 refills | Status: DC

## 2016-08-12 ENCOUNTER — Ambulatory Visit (INDEPENDENT_AMBULATORY_CARE_PROVIDER_SITE_OTHER): Payer: 59 | Admitting: Family Medicine

## 2016-08-12 DIAGNOSIS — G8929 Other chronic pain: Secondary | ICD-10-CM | POA: Insufficient documentation

## 2016-08-12 DIAGNOSIS — R1031 Right lower quadrant pain: Secondary | ICD-10-CM | POA: Diagnosis not present

## 2016-08-12 NOTE — Progress Notes (Signed)
Subjective:  Patient ID: Amy Day, female    DOB: 21-Aug-1976  Age: 40 y.o. MRN: [id number]  CC: RLQ/R sided pelvic pain  HPI:  40 year old female presents with the above complaint.  Patient has had chronic right lower quadrant/right sided pelvic pain for the past 2 years. She has seen her OB/GYN for this several times. She has had ultrasounds which revealed a right ovarian cyst as well as a small fibroid in the uterus. OB/GYN suspects underlying endometriosis. They have discussed laparoscopy and patient is not ready to proceed.  Patient states that she has pain daily. Moderate to severe. Improves slightly with NSAIDs. No associated nausea, vomiting, diarrhea, changes in bowels. No recent fever or chills. Bowels move regularly without difficulty. No trouble urinating. Patient is concerned about her chronic pain. She wants an answer. She does not feel like endometriosis is the underlying etiology. She would like to discuss this in depth today.  Social Hx   Social History   Social History  . Marital status: Single    Spouse name: N/A  . Number of children: N/A  . Years of education: N/A   Social History Main Topics  . Smoking status: Former Smoker    Quit date: 06/26/2004  . Smokeless tobacco: Not on file     Comment: Vaping   . Alcohol use 0.0 oz/week     Comment: Rare   . Drug use: No  . Sexual activity: Yes    Partners: Male    Birth control/ protection: None     Comment: Husband    Other Topics Concern  . Not on file   Social History Narrative   Married    Employed at [employer]    Some college- highest level    Caffeine- Coffee, tea, and occasional soda    Review of Systems  Constitutional: Negative.   Genitourinary: Positive for pelvic pain.   Objective:  BP (!) 155/73 (BP Location: Left Arm, Patient Position: Sitting, Cuff Size: Normal)   Pulse 79   Temp 97.5 F (36.4 C) (Oral)   Resp 14   Wt 227 lb 8 oz (103.2 kg)   SpO2 98%   BMI  36.72 kg/m   BP/Weight 08/12/2016 01/10/2016 [id number]  Systolic BP [id number]  Diastolic BP 73 76 98  Wt. (Lbs) 227.5 210.12 211.5  BMI 36.72 33.93 34.15   Physical Exam  Constitutional: She is oriented to person, place, and time. She appears well-developed. No distress.  Cardiovascular: Normal rate and regular rhythm.   Pulmonary/Chest: Effort normal and breath sounds normal.  Abdominal: Soft. She exhibits no distension.  Mild tenderness in the right lower quadrant/right inguinal region.  Neurological: She is alert and oriented to person, place, and time.  Psychiatric: She has a normal mood and affect.  Vitals reviewed.  Lab Results  Component Value Date   WBC 8.3 12/11/2015   HGB 14.8 12/11/2015   HCT 43.3 12/11/2015   PLT 323.0 12/11/2015   GLUCOSE 97 01/04/2016   CHOL 185 06/07/2015   TRIG 85 06/07/2015   HDL 59 06/07/2015   LDLCALC 109 06/07/2015   ALT 12 12/11/2015   AST 12 12/11/2015   NA 141 12/11/2015   K 4.0 12/11/2015   CL 107 12/11/2015   CREATININE 0.91 12/11/2015   BUN 12 12/11/2015   CO2 23 12/11/2015   TSH 1.66 12/11/2015   HGBA1C 5.7 01/04/2016    Assessment & Plan:   Problem List Items Addressed This Visit  Chronic RLQ pain    New problem (to me). Unclear etiology and prognosis at this time. ? Ovarian cyst causing pain. Could be Endometriosis (but I feel this is less likely). She is on OCP. We had a lengthy discussion about workup: Returning back to OB/GYN, seeing another OB/GYN for another opinion, additional imaging, etc. Patient will discuss with husband and let me know. PRN Ibuprofen as needed for pain. Continue OCP.        Follow-up: PRN  [place]

## 2016-08-12 NOTE — Progress Notes (Signed)
Pre visit review using our clinic review tool, if applicable. No additional management support is needed unless otherwise documented below in the visit note. 

## 2016-08-12 NOTE — Assessment & Plan Note (Signed)
New problem (to me). Unclear etiology and prognosis at this time. ? Ovarian cyst causing pain. Could be Endometriosis (but I feel this is less likely). She is on OCP. We had a lengthy discussion about workup: Returning back to OB/GYN, seeing another OB/GYN for another opinion, additional imaging, etc. Patient will discuss with husband and let me know. PRN Ibuprofen as needed for pain. Continue OCP.

## 2016-08-14 ENCOUNTER — Other Ambulatory Visit: Payer: Self-pay | Admitting: Family Medicine

## 2016-08-14 DIAGNOSIS — G8929 Other chronic pain: Secondary | ICD-10-CM

## 2016-08-14 DIAGNOSIS — R1031 Right lower quadrant pain: Principal | ICD-10-CM

## 2016-08-21 DIAGNOSIS — H5203 Hypermetropia, bilateral: Secondary | ICD-10-CM | POA: Diagnosis not present

## 2016-09-10 ENCOUNTER — Other Ambulatory Visit: Payer: Self-pay | Admitting: Family Medicine

## 2016-09-10 MED ORDER — LOSARTAN POTASSIUM 50 MG PO TABS: 50.0000 mg | ORAL_TABLET | Freq: Every day | ORAL | 1 refills | Status: DC

## 2016-09-10 NOTE — Telephone Encounter (Signed)
Historical medication. Pt last seen 08/12/16. Please advise?

## 2016-10-14 ENCOUNTER — Ambulatory Visit: Payer: 59

## 2016-10-17 ENCOUNTER — Ambulatory Visit: Payer: 59

## 2016-12-08 ENCOUNTER — Ambulatory Visit
Admission: RE | Admit: 2016-12-08 | Discharge: 2016-12-08 | Disposition: A | Discharge status: Home / Self Care | Payer: 59 | Source: Ambulatory Visit | Attending: Family Medicine | Admitting: Family Medicine

## 2016-12-08 ENCOUNTER — Other Ambulatory Visit: Payer: Self-pay | Admitting: Family Medicine

## 2016-12-08 DIAGNOSIS — R1031 Right lower quadrant pain: Secondary | ICD-10-CM | POA: Diagnosis not present

## 2016-12-08 DIAGNOSIS — G8929 Other chronic pain: Secondary | ICD-10-CM

## 2016-12-08 DIAGNOSIS — R109 Unspecified abdominal pain: Secondary | ICD-10-CM | POA: Diagnosis not present

## 2016-12-08 DIAGNOSIS — K8020 Calculus of gallbladder without cholecystitis without obstruction: Secondary | ICD-10-CM | POA: Insufficient documentation

## 2016-12-08 HISTORY — DX: Essential (primary) hypertension: I10

## 2016-12-08 MED ORDER — IOPAMIDOL (ISOVUE-300) INJECTION 61%
100.0000 mL | Freq: Once | INTRAVENOUS | Status: AC | PRN
  Administered 2016-12-08: 100 mL via INTRAVENOUS

## 2016-12-22 ENCOUNTER — Other Ambulatory Visit: Payer: Self-pay | Admitting: Family Medicine

## 2016-12-22 MED ORDER — TRAMADOL HCL 50 MG PO TABS: 50.0000 mg | ORAL_TABLET | Freq: Three times a day (TID) | ORAL | 0 refills | Status: DC | PRN

## 2017-01-20 ENCOUNTER — Encounter: Payer: Self-pay | Admitting: Obstetrics and Gynecology

## 2017-01-20 ENCOUNTER — Ambulatory Visit (INDEPENDENT_AMBULATORY_CARE_PROVIDER_SITE_OTHER): Payer: 59 | Admitting: Obstetrics and Gynecology

## 2017-01-20 VITALS — BP 124/74 | Ht 67.0 in | Wt 226.0 lb

## 2017-01-20 DIAGNOSIS — G8929 Other chronic pain: Secondary | ICD-10-CM

## 2017-01-20 DIAGNOSIS — Z1239 Encounter for other screening for malignant neoplasm of breast: Secondary | ICD-10-CM

## 2017-01-20 DIAGNOSIS — R1031 Right lower quadrant pain: Secondary | ICD-10-CM | POA: Diagnosis not present

## 2017-01-20 DIAGNOSIS — Z01419 Encounter for gynecological examination (general) (routine) without abnormal findings: Secondary | ICD-10-CM | POA: Diagnosis not present

## 2017-01-20 DIAGNOSIS — Z1231 Encounter for screening mammogram for malignant neoplasm of breast: Secondary | ICD-10-CM | POA: Diagnosis not present

## 2017-01-20 NOTE — Progress Notes (Signed)
Chief Complaint  Patient presents with  . Annual Exam     HPI:      [name] is a 41 y.o. [id number] who LMP was No LMP recorded. Patient has had an ablation., presents today for her annual examination.  Her menses are absent due to endometrial ablation. Dysmenorrhea none. She does not have intermenstrual bleeding. She cont to have issues with RLQ pain. She saw Dr. Glennon Mac last yr for it. She was started on aygestin daily that worked initially, but pain then recurred. She stopped aygestin. Pain sx are daily. She has f/u with Dr. Glennon Mac tomorrow.  Sex activity: single partner, contraception - tubal ligation.  Last Pap: June 05, 2015  Results were: no abnormalities /neg HPV DNA   Last mammogram:not recent. There is a FH of breast cancer in her PGM, genetic testing not indicated. There is no FH of ovarian cancer. The patient does do self-breast exams.  Tobacco use: The patient denies current or previous tobacco use. Alcohol use: social drinker Exercise: not active  She does not get adequate calcium and Vitamin D in her diet.  She has labs with her PCP.  She is having some issues with depression due to unexpected loss of father 3/18. She doesn't feel she needs meds currently.   Past Medical History:  Diagnosis Date  . GERD (gastroesophageal reflux disease)   . Hypertension     Past Surgical History:  Procedure Laterality Date  . ABLATION  2013  . BREAST REDUCTION SURGERY    . BREAST SURGERY  2012   Breast Reduction  . TUBAL LIGATION  2006    Family History  Problem Relation Age of Onset  . Diabetes Father   . Heart attack Father   . COPD Paternal Grandmother   . Breast cancer Paternal Grandmother 65    Social History   Social History  . Marital status: Single    Spouse name: N/A  . Number of children: N/A  . Years of education: N/A   Occupational History  . Not on file.   Social History Main Topics  . Smoking status: Former Smoker    Quit date:  06/26/2004  . Smokeless tobacco: Never Used     Comment: Vaping   . Alcohol use 0.0 oz/week     Comment: Rare   . Drug use: No  . Sexual activity: Yes    Partners: Male    Birth control/ protection: None     Comment: Husband    Other Topics Concern  . Not on file   Social History Narrative   Married    Employed at [employer]    Some college- highest level    Caffeine- Coffee, tea, and occasional soda      Current Outpatient Prescriptions:  .  losartan (COZAAR) 50 MG tablet, Take 1 tablet (50 mg total) by mouth daily., Disp: 90 tablet, Rfl: 1 .  meloxicam (MOBIC) 15 MG tablet, Take 1 tablet (15 mg total) by mouth daily as needed for pain., Disp: 60 tablet, Rfl: 0 .  omeprazole (PRILOSEC) 40 MG capsule, Take 1 capsule (40 mg total) by mouth daily., Disp: 90 capsule, Rfl: 3 .  traMADol (ULTRAM) 50 MG tablet, Take 1-2 tablets (50-100 mg total) by mouth every 8 (eight) hours as needed., Disp: 60 tablet, Rfl: 0 .  norethindrone (AYGESTIN) 5 MG tablet, Take 15 mg by mouth daily., Disp: , Rfl:   ROS:  Review of Systems  Constitutional: Negative for  fatigue, fever and unexpected weight change.  Respiratory: Negative for cough, shortness of breath and wheezing.   Cardiovascular: Negative for chest pain, palpitations and leg swelling.  Gastrointestinal: Negative for blood in stool, constipation, diarrhea, nausea and vomiting.  Endocrine: Negative for cold intolerance, heat intolerance and polyuria.  Genitourinary: Negative for dyspareunia, dysuria, flank pain, frequency, genital sores, hematuria, menstrual problem, pelvic pain, urgency, vaginal bleeding, vaginal discharge and vaginal pain.  Musculoskeletal: Negative for back pain, joint swelling and myalgias.  Skin: Negative for rash.  Neurological: Negative for dizziness, syncope, light-headedness, numbness and headaches.  Hematological: Negative for adenopathy.  Psychiatric/Behavioral: Positive for dysphoric mood.  Negative for agitation, confusion, sleep disturbance and suicidal ideas. The patient is not nervous/anxious.      Objective: BP 124/74   Ht 5\' 7"  (1.702 m)   Wt 226 lb (102.5 kg)   BMI 35.40 kg/m    Physical Exam  Constitutional: She is oriented to person, place, and time. She appears well-developed and well-nourished.  Genitourinary: Vagina normal and uterus normal. There is no rash or tenderness on the right labia. There is no rash or tenderness on the left labia. No erythema or tenderness in the vagina. No vaginal discharge found. Right adnexum does not display mass and does not display tenderness. Left adnexum does not display mass and does not display tenderness. Cervix does not exhibit motion tenderness or polyp. Uterus is not enlarged or tender.  Neck: Normal range of motion. No thyromegaly present.  Cardiovascular: Normal rate, regular rhythm and normal heart sounds.   No murmur heard. Pulmonary/Chest: Effort normal and breath sounds normal. Right breast exhibits no mass, no nipple discharge, no skin change and no tenderness. Left breast exhibits no mass, no nipple discharge, no skin change and no tenderness.  Abdominal: Soft. There is no tenderness. There is no guarding.  Musculoskeletal: Normal range of motion.  Neurological: She is alert and oriented to person, place, and time. No cranial nerve deficit.  Psychiatric: She has a normal mood and affect. Her behavior is normal.  Vitals reviewed.    Assessment/Plan: Encounter for annual routine gynecological examination  Screening for breast cancer - Pt to sched mammo. - Plan: MM DIGITAL SCREENING BILATERAL  Chronic RLQ pain - Has f/u with Dr. Glennon Mac tomorrow.            GYN counsel mammography screening, adequate intake of calcium and vitamin D, diet and exercise     F/U  Return in about 1 year (around 01/20/2018).  Litsy Epting B. Trixie Maclaren, PA-C 01/20/2017 11:00 AM

## 2017-01-21 ENCOUNTER — Ambulatory Visit (INDEPENDENT_AMBULATORY_CARE_PROVIDER_SITE_OTHER): Payer: 59 | Admitting: Obstetrics and Gynecology

## 2017-01-21 ENCOUNTER — Encounter: Payer: Self-pay | Admitting: Obstetrics and Gynecology

## 2017-01-21 VITALS — BP 118/74 | Ht 67.0 in | Wt 226.0 lb

## 2017-01-21 DIAGNOSIS — G8929 Other chronic pain: Secondary | ICD-10-CM

## 2017-01-21 DIAGNOSIS — R1031 Right lower quadrant pain: Secondary | ICD-10-CM

## 2017-01-21 MED ORDER — GABAPENTIN (ONCE-DAILY) 300 MG PO TABS: 1.0000 | ORAL_TABLET | Freq: Three times a day (TID) | ORAL | 2 refills | Status: DC

## 2017-01-21 NOTE — Progress Notes (Signed)
Obstetrics & Gynecology Office Visit   Chief Complaint  Patient presents with  . Follow-up  . Pelvic Pain    History of Present Illness: 41 y.o. G33P2002 female with seen in follow up of RLQ pain.  Patient evaluated for this prior.  Was started on norethindrone 5mg  last year, which initially worked. However, the pain returned and she stopped taking the medication. The pain has returned.  Her symptoms are daily.  See prior notes for complete details.   Review of Systems: Review of Systems  Constitutional: Negative.   HENT: Negative.   Eyes: Negative.   Respiratory: Negative.   Cardiovascular: Negative.   Gastrointestinal: Positive for abdominal pain (per HPI).  Genitourinary: Negative.   Musculoskeletal: Negative.   Skin: Negative.   Neurological: Negative.   Psychiatric/Behavioral: Negative.     Past Medical History:  Diagnosis Date  . GERD (gastroesophageal reflux disease)   . Hypertension     Past Surgical History:  Procedure Laterality Date  . ABLATION  2013  . BREAST REDUCTION SURGERY    . BREAST SURGERY  2012   Breast Reduction  . TUBAL LIGATION  2006    Gynecologic History: No LMP recorded. Patient has had an ablation.  Obstetric History: [id number]  Family History  Problem Relation Age of Onset  . Diabetes Father   . Heart attack Father   . COPD Paternal Grandmother   . Breast cancer Paternal Grandmother 82    Social History   Social History  . Marital status: Single    Spouse name: N/A  . Number of children: N/A  . Years of education: N/A   Occupational History  . Not on file.   Social History Main Topics  . Smoking status: Former Smoker    Quit date: 06/26/2004  . Smokeless tobacco: Never Used     Comment: Vaping   . Alcohol use 0.0 oz/week     Comment: Rare   . Drug use: No  . Sexual activity: Yes    Partners: Male    Birth control/ protection: None     Comment: Husband    Other Topics Concern  . Not on file   Social History  Narrative   Married    Employed at [employer]    Some college- highest level    Caffeine- Coffee, tea, and occasional soda     Allergies:  No Known Allergies  Medications:   Medication Sig Start Date End Date Taking? Authorizing Provider  losartan (COZAAR) 50 MG tablet Take 1 tablet (50 mg total) by mouth daily. 09/10/16   Coral Spikes, DO  meloxicam (MOBIC) 15 MG tablet Take 1 tablet (15 mg total) by mouth daily as needed for pain. 03/10/16   Coral Spikes, DO  omeprazole (PRILOSEC) 40 MG capsule Take 1 capsule (40 mg total) by mouth daily. 08/08/16   Coral Spikes, DO  traMADol (ULTRAM) 50 MG tablet Take 1-2 tablets (50-100 mg total) by mouth every 8 (eight) hours as needed. 12/22/16   Coral Spikes, DO    Physical Exam BP 118/74   Ht 5\' 7"  (1.702 m)   Wt 226 lb (102.5 kg)   BMI 35.40 kg/m  No LMP recorded. Patient has had an ablation. Physical Exam  Constitutional: She is oriented to person, place, and time and well-developed, well-nourished, and in no distress. No distress.  HENT:  Head: Normocephalic and atraumatic.  Eyes: Conjunctivae are normal. No scleral icterus.  Pulmonary/Chest: No respiratory distress.  Neurological:  She is alert and oriented to person, place, and time.  Psychiatric: Mood, affect and judgment normal.    Assessment: 41 y.o. [id number] with chronic right low quadrant pain.  Seems hormonally responsive.  Working diagnosis for now is endometriosis.  Discussed other possible etiologies.    Plan: Problem List Items Addressed This Visit    Chronic RLQ pain - Primary    Discussed treatments options: 1) do nothing, 2) hormonal and/or medical manipulation, 3) surgery either laparoscopy to diagnose or hysterectomy. Patient prefers medical treatment. Will start with gabapentin for neuropathic pain for now.      Relevant Medications   Gabapentin, Once-Daily, 300 MG TABS     Follow up in 3 months  20 minutes spent in face to face discussion  with > 50% spent in counseling and management of her chronic right lower quadrant pain.   Prentice Docker, MD 01/21/2017 6:10 PM

## 2017-01-21 NOTE — Assessment & Plan Note (Signed)
Discussed treatments options: 1) do nothing, 2) hormonal and/or medical manipulation, 3) surgery either laparoscopy to diagnose or hysterectomy. Patient prefers medical treatment. Will start with gabapentin for neuropathic pain for now.

## 2017-01-23 MED ORDER — GABAPENTIN 300 MG PO CAPS: 300.0000 mg | ORAL_CAPSULE | Freq: Three times a day (TID) | ORAL | 3 refills | Status: DC

## 2017-01-23 MED ORDER — GABAPENTIN (ONCE-DAILY) 300 MG PO TABS: 1.0000 | ORAL_TABLET | Freq: Three times a day (TID) | ORAL | 2 refills | Status: DC

## 2017-01-23 NOTE — Addendum Note (Signed)
Addended by: Prentice Docker D on: 01/23/2017 12:32 PM   Modules accepted: Orders

## 2017-01-23 NOTE — Addendum Note (Signed)
Addended by: Prentice Docker D on: 01/23/2017 03:25 PM   Modules accepted: Orders

## 2017-01-27 ENCOUNTER — Ambulatory Visit: Payer: 59 | Admitting: Gastroenterology

## 2017-02-07 ENCOUNTER — Encounter: Payer: Self-pay | Admitting: Obstetrics and Gynecology

## 2017-03-31 ENCOUNTER — Other Ambulatory Visit: Payer: Self-pay | Admitting: Family Medicine

## 2017-04-16 ENCOUNTER — Ambulatory Visit
Admission: RE | Admit: 2017-04-16 | Discharge: 2017-04-16 | Disposition: A | Discharge status: Home / Self Care | Payer: 59 | Source: Ambulatory Visit | Attending: Obstetrics and Gynecology | Admitting: Obstetrics and Gynecology

## 2017-04-16 ENCOUNTER — Encounter: Payer: Self-pay | Admitting: Obstetrics and Gynecology

## 2017-04-16 DIAGNOSIS — Z1231 Encounter for screening mammogram for malignant neoplasm of breast: Secondary | ICD-10-CM | POA: Insufficient documentation

## 2017-04-16 DIAGNOSIS — Z1239 Encounter for other screening for malignant neoplasm of breast: Secondary | ICD-10-CM

## 2017-05-19 ENCOUNTER — Other Ambulatory Visit: Payer: Self-pay | Admitting: Family Medicine

## 2017-05-19 DIAGNOSIS — R1031 Right lower quadrant pain: Principal | ICD-10-CM

## 2017-05-19 DIAGNOSIS — G8929 Other chronic pain: Secondary | ICD-10-CM

## 2017-05-25 ENCOUNTER — Encounter: Payer: Self-pay | Admitting: Gastroenterology

## 2017-05-25 ENCOUNTER — Ambulatory Visit (INDEPENDENT_AMBULATORY_CARE_PROVIDER_SITE_OTHER): Payer: 59 | Admitting: Gastroenterology

## 2017-05-25 VITALS — BP 109/70 | HR 86 | Temp 98.2°F | Ht 67.0 in | Wt 224.8 lb

## 2017-05-25 DIAGNOSIS — G8929 Other chronic pain: Secondary | ICD-10-CM | POA: Diagnosis not present

## 2017-05-25 DIAGNOSIS — R1031 Right lower quadrant pain: Secondary | ICD-10-CM

## 2017-05-25 NOTE — Progress Notes (Signed)
Cephas Darby, MD [address]  [place]  [place], [place] [address]  Main: [phone]  Fax: [phone]    Gastroenterology Consultation  Referring Provider:     Coral Spikes, DO Primary Care Physician:  Coral Spikes, DO Primary Gastroenterologist:  Dr. Cephas Darby Reason for Consultation:     Chronic RLQ pain        HPI:   Amy Day is a 40 y.o. y/o female referred by Dr. Coral Spikes, DO  for consultation & management of chronic RLQ since 4 years. Sporadic, sudden onset, 8/10, Localized, tried gabapentin, tramadol, ibuprofen 200mg  8pills. Last for 2 hrs approx. A/w nausea, sometimes hard stool or loose stool, but denies vomiting/f/c. Only had 1 episode of rectal bleeding a/w pain. BMs are otherwise regular. Weight stable. Denies any upper GI symptoms. Last attack was 1 week ago. Occur every couple of months or every few weeks.  Denies family h/o IBD, GI malignancy No GI surgeries Had 2 C-sections CT showed 2 cm gallstones  LFTs normal No IDA She has been receiving gabapentin for endometriosis, with no relief GI Procedures: none  Past Medical History:  Diagnosis Date  . GERD (gastroesophageal reflux disease)   . Hypertension     Past Surgical History:  Procedure Laterality Date  . ABLATION  2013  . BREAST REDUCTION SURGERY    . BREAST SURGERY  2012   Breast Reduction  . REDUCTION MAMMAPLASTY     followed by breast lift  . TUBAL LIGATION  2006    Prior to Admission medications   Medication Sig Start Date End Date Taking? Authorizing Provider  gabapentin (NEURONTIN) 300 MG capsule Take 1 capsule (300 mg total) by mouth 3 (three) times daily. Start 1 tab per day days 1-5, 2 tabs days 6-10, then 3 tabs daily 01/23/17  Yes Will Bonnet, MD  Gabapentin, Once-Daily, 300 MG TABS Take 1 tablet by mouth 3 (three) times daily. Start 1 tab per day days 1-5, 2 tabs days 6-10, then 3 tabs daily 01/23/17  Yes Will Bonnet, MD  losartan (COZAAR)  50 MG tablet TAKE 1 TABLET (50 MG TOTAL) BY MOUTH DAILY. 03/31/17  Yes Cook, Jayce G, DO  meloxicam (MOBIC) 15 MG tablet Take 1 tablet (15 mg total) by mouth daily as needed for pain. 03/10/16  Yes Cook, Jayce G, DO  omeprazole (PRILOSEC) 40 MG capsule Take 1 capsule (40 mg total) by mouth daily. 08/08/16  Yes Cook, Jayce G, DO  traMADol (ULTRAM) 50 MG tablet Take 1-2 tablets (50-100 mg total) by mouth every 8 (eight) hours as needed. 12/22/16  Yes Coral Spikes, DO    Family History  Problem Relation Age of Onset  . Diabetes Father   . Heart attack Father   . COPD Paternal Grandmother   . Breast cancer Paternal Grandmother 74     Social History  Substance Use Topics  . Smoking status: Former Smoker    Quit date: 06/26/2004  . Smokeless tobacco: Never Used     Comment: Vaping   . Alcohol use 0.0 oz/week     Comment: Rare     Allergies as of 05/25/2017  . (No Known Allergies)    Review of Systems:    All systems reviewed and negative except where noted in HPI.   Physical Exam:  BP 109/70   Pulse 86   Temp 98.2 F (36.8 C) (Oral)   Ht 5\' 7"  (1.702 m)  Wt 224 lb 12.8 oz (102 kg)   BMI 35.21 kg/m  No LMP recorded. Patient has had an ablation.  General:   Alert,  Well-developed, well-nourished, pleasant and cooperative in NAD Head:  Normocephalic and atraumatic. Eyes:  Sclera clear, no icterus.   Conjunctiva pink. Ears:  Normal auditory acuity. Nose:  No deformity, discharge, or lesions. Mouth:  No deformity or lesions,oropharynx pink & moist. Neck:  Supple; no masses or thyromegaly. Lungs:  Respirations even and unlabored.  Clear throughout to auscultation.   No wheezes, crackles, or rhonchi. No acute distress. Heart:  Regular rate and rhythm; no murmurs, clicks, rubs, or gallops. Abdomen:  Normal bowel sounds.  No bruits.  Soft, non-tender and non-distended without masses, hepatosplenomegaly or hernias noted.  No guarding or rebound tenderness.   Rectal: Nor  performed Msk:  Symmetrical without gross deformities. Good, equal movement & strength bilaterally. Pulses:  Normal pulses noted. Extremities:  No clubbing or edema.  No cyanosis. Neurologic:  Alert and oriented x3;  grossly normal neurologically. Skin:  Intact without significant lesions or rashes. No jaundice. Lymph Nodes:  No significant cervical adenopathy. Psych:  Alert and cooperative. Normal mood and affect.  Imaging Studies: CT A/P with contrast 12/08/2016 IMPRESSION: No acute findings in the abdomen or pelvis.  Cholelithiasis.  Small low-density area in the uterine fundus, possibly small fibroid.  Assessment and Plan:   Amy Day is a 41 y.o. female with Chronic intermittent episodes of right lower quadrant pain associated with nausea and altered bowel habits. CT A/P unremarkable. I recommend colonoscopy for further evaluation of ileocecal region and right colon.  Follow up after the colonoscopy   Cephas Darby, MD

## 2017-06-01 ENCOUNTER — Other Ambulatory Visit: Payer: Self-pay

## 2017-06-01 DIAGNOSIS — R1031 Right lower quadrant pain: Principal | ICD-10-CM

## 2017-06-01 DIAGNOSIS — G8929 Other chronic pain: Secondary | ICD-10-CM

## 2017-06-24 ENCOUNTER — Encounter: Payer: Self-pay | Admitting: *Deleted

## 2017-06-25 ENCOUNTER — Ambulatory Visit
Admission: RE | Admit: 2017-06-25 | Discharge: 2017-06-25 | Disposition: A | Discharge status: Home / Self Care | Payer: 59 | Source: Ambulatory Visit | Attending: Gastroenterology | Admitting: Gastroenterology

## 2017-06-25 ENCOUNTER — Ambulatory Visit: Payer: 59 | Admitting: Anesthesiology

## 2017-06-25 ENCOUNTER — Encounter: Payer: Self-pay | Admitting: *Deleted

## 2017-06-25 ENCOUNTER — Encounter
Admission: RE | Disposition: A | Discharge status: Home / Self Care | Payer: Self-pay | Source: Ambulatory Visit | Attending: Gastroenterology

## 2017-06-25 DIAGNOSIS — K635 Polyp of colon: Secondary | ICD-10-CM | POA: Diagnosis not present

## 2017-06-25 DIAGNOSIS — I10 Essential (primary) hypertension: Secondary | ICD-10-CM | POA: Insufficient documentation

## 2017-06-25 DIAGNOSIS — G8929 Other chronic pain: Secondary | ICD-10-CM | POA: Diagnosis not present

## 2017-06-25 DIAGNOSIS — K621 Rectal polyp: Secondary | ICD-10-CM | POA: Insufficient documentation

## 2017-06-25 DIAGNOSIS — F17290 Nicotine dependence, other tobacco product, uncomplicated: Secondary | ICD-10-CM | POA: Diagnosis not present

## 2017-06-25 DIAGNOSIS — K219 Gastro-esophageal reflux disease without esophagitis: Secondary | ICD-10-CM | POA: Diagnosis not present

## 2017-06-25 DIAGNOSIS — Z79899 Other long term (current) drug therapy: Secondary | ICD-10-CM | POA: Diagnosis not present

## 2017-06-25 DIAGNOSIS — R1031 Right lower quadrant pain: Secondary | ICD-10-CM | POA: Diagnosis not present

## 2017-06-25 HISTORY — PX: COLONOSCOPY WITH PROPOFOL: SHX5780

## 2017-06-25 SURGERY — COLONOSCOPY WITH PROPOFOL
Anesthesia: General

## 2017-06-25 MED ORDER — MIDAZOLAM HCL 2 MG/2ML IJ SOLN
INTRAMUSCULAR | Status: DC | PRN
  Administered 2017-06-25 (×2): 1 mg via INTRAVENOUS

## 2017-06-25 MED ORDER — PROPOFOL 500 MG/50ML IV EMUL
INTRAVENOUS | Status: DC | PRN
  Administered 2017-06-25: 140 ug/kg/min via INTRAVENOUS

## 2017-06-25 MED ORDER — FENTANYL CITRATE (PF) 100 MCG/2ML IJ SOLN
INTRAMUSCULAR | Status: AC
  Filled 2017-06-25: qty 2

## 2017-06-25 MED ORDER — MIDAZOLAM HCL 2 MG/2ML IJ SOLN
INTRAMUSCULAR | Status: AC
  Filled 2017-06-25: qty 2

## 2017-06-25 MED ORDER — PROPOFOL 500 MG/50ML IV EMUL
INTRAVENOUS | Status: AC
  Filled 2017-06-25: qty 50

## 2017-06-25 MED ORDER — FENTANYL CITRATE (PF) 100 MCG/2ML IJ SOLN
INTRAMUSCULAR | Status: DC | PRN
  Administered 2017-06-25: 50 ug via INTRAVENOUS

## 2017-06-25 MED ORDER — SODIUM CHLORIDE 0.9 % IV SOLN
INTRAVENOUS | Status: DC
  Administered 2017-06-25: 12:00:00 via INTRAVENOUS

## 2017-06-25 NOTE — Transfer of Care (Signed)
Immediate Anesthesia Transfer of Care Note  Patient: Amy Day  Procedure(s) Performed: COLONOSCOPY WITH PROPOFOL (N/A )  Patient Location: PACU  Anesthesia Type:General  Level of Consciousness: awake and sedated  Airway & Oxygen Therapy: Patient Spontanous Breathing and Patient connected to nasal cannula oxygen  Post-op Assessment: Report given to RN and Post -op Vital signs reviewed and stable  Post vital signs: Reviewed and stable  Last Vitals:  Vitals:   06/25/17 1155  BP: 139/90  Pulse: 88  Resp: 18  Temp: (!) 36.4 C  SpO2: 99%    Last Pain:  Vitals:   06/25/17 1155  TempSrc: Tympanic         Complications: No apparent anesthesia complications

## 2017-06-25 NOTE — Anesthesia Preprocedure Evaluation (Signed)
Anesthesia Evaluation  Patient identified by MRN, date of birth, ID band Patient awake    Reviewed: Allergy & Precautions, NPO status , Patient's Chart, lab work & pertinent test results  History of Anesthesia Complications Negative for: history of anesthetic complications  Airway Mallampati: I  TM Distance: >3 FB Neck ROM: Full    Dental no notable dental hx.    Pulmonary neg COPD, Current Smoker (ecigarettes),    breath sounds clear to auscultation- rhonchi (-) wheezing      Cardiovascular Exercise Tolerance: Good hypertension, Pt. on medications (-) CAD, (-) Past MI and (-) Cardiac Stents  Rhythm:Regular Rate:Normal - Systolic murmurs and - Diastolic murmurs    Neuro/Psych negative neurological ROS  negative psych ROS   GI/Hepatic Neg liver ROS, GERD  ,  Endo/Other  negative endocrine ROSneg diabetes  Renal/GU negative Renal ROS     Musculoskeletal negative musculoskeletal ROS (+)   Abdominal (+) + obese,   Peds  Hematology negative hematology ROS (+)   Anesthesia Other Findings Past Medical History: No date: GERD (gastroesophageal reflux disease) No date: Hypertension   Reproductive/Obstetrics                             Anesthesia Physical Anesthesia Plan  ASA: II  Anesthesia Plan: General   Post-op Pain Management:    Induction: Intravenous  PONV Risk Score and Plan: 1 and Propofol infusion  Airway Management Planned: Natural Airway  Additional Equipment:   Intra-op Plan:   Post-operative Plan:   Informed Consent: I have reviewed the patients History and Physical, chart, labs and discussed the procedure including the risks, benefits and alternatives for the proposed anesthesia with the patient or authorized representative who has indicated his/her understanding and acceptance.   Dental advisory given  Plan Discussed with: CRNA and Anesthesiologist  Anesthesia  Plan Comments:         Anesthesia Quick Evaluation

## 2017-06-25 NOTE — Op Note (Signed)
[facility] Patient Name: Amy Day Procedure Date: 06/25/2017 12:36 PM MRN: [id number] Account #: [id number] Date of Birth: [birth year] Admit Type: Outpatient Age: 41 Room: [facility] ENDO ROOM 3 Gender: Female Note Status: Finalized Procedure:            Colonoscopy Indications:          Abdominal pain in the right lower quadrant Providers:            Lin Landsman MD, MD Referring MD:         Angela Adam. Caryl Bis (Referring MD) Medicines:            Monitored Anesthesia Care Complications:        No immediate complications. Estimated blood loss: None. Procedure:            Pre-Anesthesia Assessment:                       - Prior to the procedure, a History and Physical was                        performed, and patient medications and allergies were                        reviewed. The patient is competent. The risks and                        benefits of the procedure and the sedation options and                        risks were discussed with the patient. All questions                        were answered and informed consent was obtained.                        Patient identification and proposed procedure were                        verified by the physician, the nurse, the                        anesthesiologist, the anesthetist and the technician in                        the pre-procedure area in the procedure room. Mental                        Status Examination: alert and oriented. Airway                        Examination: normal oropharyngeal airway and neck                        mobility. Respiratory Examination: clear to                        auscultation. CV Examination: normal. Prophylactic                        Antibiotics: The patient does not require prophylactic  antibiotics. Prior Anticoagulants: The patient has                        taken no previous anticoagulant or antiplatelet agents.                         ASA Grade Assessment: II - A patient with mild systemic                        disease. After reviewing the risks and benefits, the                        patient was deemed in satisfactory condition to undergo                        the procedure. The anesthesia plan was to use monitored                        anesthesia care (MAC). Immediately prior to                        administration of medications, the patient was                        re-assessed for adequacy to receive sedatives. The                        heart rate, respiratory rate, oxygen saturations, blood                        pressure, adequacy of pulmonary ventilation, and                        response to care were monitored throughout the                        procedure. The physical status of the patient was                        re-assessed after the procedure.                       After obtaining informed consent, the colonoscope was                        passed under direct vision. Throughout the procedure,                        the patient's blood pressure, pulse, and oxygen                        saturations were monitored continuously. The                        Colonoscope was introduced through the anus and                        advanced to the 5 cm into the ileum. The colonoscopy  was performed without difficulty. The patient tolerated                        the procedure well. The quality of the bowel                        preparation was evaluated using the BBPS [facility] Bowel                        Preparation Scale) with scores of: Right Colon = 3,                        Transverse Colon = 3 and Left Colon = 3 (entire mucosa                        seen well with no residual staining, small fragments of                        stool or opaque liquid). The total BBPS score equals 9. Findings:      The perianal and digital rectal examinations were normal. Pertinent        negatives include normal sphincter tone and no palpable rectal lesions.      The terminal ileum appeared normal.      A 5 mm polyp was found in the rectum. The polyp was sessile. The polyp       was removed with a cold snare. Resection and retrieval were complete.      A 9 mm polyp was found in the rectum (benign-appearing lesion). The       polyp was semi-pedunculated. The polyp was removed with a hot snare.       Resection and retrieval were complete. To prevent bleeding after the       polypectomy, one hemostatic clip was successfully placed (MR       conditional). There was no bleeding during, or at the end, of the       procedure.      No additional abnormalities were found on retroflexion. Impression:           - The examined portion of the ileum was normal.                       - One 5 mm polyp in the rectum, removed with a cold                        snare. Resected and retrieved.                       - One benign appearing 9 mm polyp in the rectum,                        removed with a hot snare. Resected and retrieved. Clip                        (MR conditional) was placed.                       - No colonoscopic findings to explain pt's chronic  intermittent RLQ pain Recommendation:       - Discharge patient to home.                       - Resume previous diet today.                       - Continue present medications.                       - Recommend trial of levsin or dicyclomine for RLQ pain                       - Await pathology results.                       - Repeat colonoscopy in 3 - 5 years for surveillance                        based on pathology results.                       - Return to GI clinic as previously scheduled. Procedure Code(s):    --- Professional ---                       [phone], Colonoscopy, flexible; with removal of tumor(s),                        polyp(s), or other lesion(s) by snare technique Diagnosis Code(s):     --- Professional ---                       K62.1, Rectal polyp                       R10.31, Right lower quadrant pain CPT copyright 2016 American Medical Association. All rights reserved. The codes documented in this report are preliminary and upon coder review may  be revised to meet current compliance requirements. Dr. Ulyess Mort Lin Landsman MD, MD 06/25/2017 1:29:32 PM This report has been signed electronically. Number of Addenda: 0 Note Initiated On: 06/25/2017 12:36 PM Scope Withdrawal Time: 0 hours 13 minutes 53 seconds  Total Procedure Duration: 0 hours 18 minutes 5 seconds       [facility]

## 2017-06-25 NOTE — Anesthesia Post-op Follow-up Note (Signed)
Anesthesia QCDR form completed.        

## 2017-06-25 NOTE — H&P (Signed)
Cephas Darby, MD [address]  [place]  [place], [place] [address]  Main: [phone]  Fax: [phone] Pager: [phone]  Primary Care Physician:  Leone Haven, MD Primary Gastroenterologist:  Dr. Cephas Darby  Pre-Procedure History & Physical: HPI:  Amy Day is a 41 y.o. female is here for an colonoscopy.   Past Medical History:  Diagnosis Date  . GERD (gastroesophageal reflux disease)   . Hypertension     Past Surgical History:  Procedure Laterality Date  . ABLATION  2013  . BREAST REDUCTION SURGERY    . BREAST SURGERY  2012   Breast Reduction  . REDUCTION MAMMAPLASTY     followed by breast lift  . TUBAL LIGATION  2006    Prior to Admission medications   Medication Sig Start Date End Date Taking? Authorizing Provider  gabapentin (NEURONTIN) 300 MG capsule Take 1 capsule (300 mg total) by mouth 3 (three) times daily. Start 1 tab per day days 1-5, 2 tabs days 6-10, then 3 tabs daily 01/23/17  Yes Will Bonnet, MD  losartan (COZAAR) 50 MG tablet TAKE 1 TABLET (50 MG TOTAL) BY MOUTH DAILY. 03/31/17  Yes Cook, Jayce G, DO  omeprazole (PRILOSEC) 40 MG capsule Take 1 capsule (40 mg total) by mouth daily. 08/08/16  Yes Cook, Jayce G, DO  traMADol (ULTRAM) 50 MG tablet Take 1-2 tablets (50-100 mg total) by mouth every 8 (eight) hours as needed. 12/22/16  Yes Cook, Jayce G, DO  Gabapentin, Once-Daily, 300 MG TABS Take 1 tablet by mouth 3 (three) times daily. Start 1 tab per day days 1-5, 2 tabs days 6-10, then 3 tabs daily 01/23/17   Will Bonnet, MD  meloxicam (MOBIC) 15 MG tablet Take 1 tablet (15 mg total) by mouth daily as needed for pain. 03/10/16   Coral Spikes, DO    Allergies as of 06/01/2017  . (No Known Allergies)    Family History  Problem Relation Age of Onset  . Diabetes Father   . Heart attack Father   . COPD Paternal Grandmother   . Breast cancer Paternal Grandmother 75    Social History   Social History  . Marital  status: Married    Spouse name: N/A  . Number of children: N/A  . Years of education: N/A   Occupational History  . Not on file.   Social History Main Topics  . Smoking status: Current Every Day Smoker    Types: E-cigarettes    Last attempt to quit: 06/26/2004  . Smokeless tobacco: Never Used     Comment: Vaping   . Alcohol use 0.0 oz/week     Comment: Rare   . Drug use: No  . Sexual activity: Yes    Partners: Male    Birth control/ protection: None     Comment: Husband    Other Topics Concern  . Not on file   Social History Narrative   Married    Employed at [employer]    Some college- highest level    Caffeine- Coffee, tea, and occasional soda     Review of Systems: See HPI, otherwise negative ROS  Physical Exam: BP 139/90   Pulse 88   Temp (!) 97.5 F (36.4 C) (Tympanic)   Resp 18   Ht 5\' 7"  (1.702 m)   Wt 224 lb (101.6 kg)   SpO2 99%   BMI 35.08 kg/m  General:   Alert,  pleasant and cooperative in  NAD Head:  Normocephalic and atraumatic. Neck:  Supple; no masses or thyromegaly. Lungs:  Clear throughout to auscultation.    Heart:  Regular rate and rhythm. Abdomen:  Soft, nontender and nondistended. Normal bowel sounds, without guarding, and without rebound.   Neurologic:  Alert and  oriented x4;  grossly normal neurologically.  Impression/Plan: Amy Day is here for an colonoscopy to be performed for chronic RLQ pain  Risks, benefits, limitations, and alternatives regarding  colonoscopy have been reviewed with the patient.  Questions have been answered.  All parties agreeable.   Amy Sear, MD  06/25/2017, 12:36 PM

## 2017-06-25 NOTE — Anesthesia Postprocedure Evaluation (Signed)
Anesthesia Post Note  Patient: Amy Day  Procedure(s) Performed: COLONOSCOPY WITH PROPOFOL (N/A )  Patient location during evaluation: Endoscopy Anesthesia Type: General Level of consciousness: awake and alert and oriented Pain management: pain level controlled Vital Signs Assessment: post-procedure vital signs reviewed and stable Respiratory status: spontaneous breathing, nonlabored ventilation and respiratory function stable Cardiovascular status: blood pressure returned to baseline and stable Postop Assessment: no signs of nausea or vomiting Anesthetic complications: no     Last Vitals:  Vitals:   06/25/17 1155 06/25/17 1319  BP: 139/90 105/77  Pulse: 88 94  Resp: 18   Temp: (!) 36.4 C (!) 36.1 C  SpO2: 99% 100%    Last Pain:  Vitals:   06/25/17 1319  TempSrc: Tympanic                 Sharifah Champine

## 2017-06-26 ENCOUNTER — Encounter: Payer: Self-pay | Admitting: Gastroenterology

## 2017-06-26 LAB — SURGICAL PATHOLOGY

## 2017-06-29 ENCOUNTER — Encounter: Payer: Self-pay | Admitting: Gastroenterology

## 2017-07-02 ENCOUNTER — Other Ambulatory Visit: Payer: Self-pay | Admitting: Gastroenterology

## 2017-07-02 MED ORDER — HYOSCYAMINE SULFATE 0.125 MG PO TABS: 0.1250 mg | ORAL_TABLET | ORAL | 0 refills | Status: DC | PRN

## 2017-07-29 ENCOUNTER — Encounter: Payer: Self-pay | Admitting: Gastroenterology

## 2017-07-29 ENCOUNTER — Other Ambulatory Visit: Payer: Self-pay | Admitting: Gastroenterology

## 2017-07-29 MED ORDER — AMITRIPTYLINE HCL 25 MG PO TABS: 25.0000 mg | ORAL_TABLET | Freq: Every day | ORAL | 2 refills | Status: DC

## 2017-09-10 DIAGNOSIS — G8929 Other chronic pain: Secondary | ICD-10-CM | POA: Diagnosis not present

## 2017-09-10 DIAGNOSIS — R102 Pelvic and perineal pain: Secondary | ICD-10-CM | POA: Diagnosis not present

## 2017-09-10 DIAGNOSIS — I10 Essential (primary) hypertension: Secondary | ICD-10-CM | POA: Diagnosis not present

## 2017-09-10 DIAGNOSIS — K219 Gastro-esophageal reflux disease without esophagitis: Secondary | ICD-10-CM | POA: Diagnosis not present

## 2017-09-10 DIAGNOSIS — N898 Other specified noninflammatory disorders of vagina: Secondary | ICD-10-CM | POA: Diagnosis not present

## 2017-09-10 DIAGNOSIS — F17210 Nicotine dependence, cigarettes, uncomplicated: Secondary | ICD-10-CM | POA: Diagnosis not present

## 2017-09-10 DIAGNOSIS — R9389 Abnormal findings on diagnostic imaging of other specified body structures: Secondary | ICD-10-CM | POA: Diagnosis not present

## 2017-09-17 DIAGNOSIS — K219 Gastro-esophageal reflux disease without esophagitis: Secondary | ICD-10-CM | POA: Diagnosis not present

## 2017-09-17 DIAGNOSIS — R110 Nausea: Secondary | ICD-10-CM | POA: Diagnosis not present

## 2017-09-17 DIAGNOSIS — I10 Essential (primary) hypertension: Secondary | ICD-10-CM | POA: Diagnosis not present

## 2017-09-17 DIAGNOSIS — R102 Pelvic and perineal pain: Secondary | ICD-10-CM | POA: Diagnosis not present

## 2017-09-17 DIAGNOSIS — F17210 Nicotine dependence, cigarettes, uncomplicated: Secondary | ICD-10-CM | POA: Diagnosis not present

## 2017-09-17 DIAGNOSIS — Z79899 Other long term (current) drug therapy: Secondary | ICD-10-CM | POA: Diagnosis not present

## 2017-09-23 DIAGNOSIS — G8929 Other chronic pain: Secondary | ICD-10-CM | POA: Insufficient documentation

## 2017-09-23 DIAGNOSIS — N879 Dysplasia of cervix uteri, unspecified: Secondary | ICD-10-CM | POA: Diagnosis not present

## 2017-09-23 DIAGNOSIS — R102 Pelvic and perineal pain unspecified side: Secondary | ICD-10-CM | POA: Insufficient documentation

## 2017-09-23 DIAGNOSIS — K219 Gastro-esophageal reflux disease without esophagitis: Secondary | ICD-10-CM | POA: Diagnosis not present

## 2017-09-23 DIAGNOSIS — Z01818 Encounter for other preprocedural examination: Secondary | ICD-10-CM | POA: Diagnosis not present

## 2017-09-23 DIAGNOSIS — F17210 Nicotine dependence, cigarettes, uncomplicated: Secondary | ICD-10-CM | POA: Diagnosis not present

## 2017-09-23 DIAGNOSIS — Z9889 Other specified postprocedural states: Secondary | ICD-10-CM | POA: Diagnosis not present

## 2017-09-23 DIAGNOSIS — I10 Essential (primary) hypertension: Secondary | ICD-10-CM | POA: Diagnosis not present

## 2017-10-01 ENCOUNTER — Telehealth: Payer: Self-pay | Admitting: Family Medicine

## 2017-10-01 MED ORDER — FLUTICASONE PROPIONATE 50 MCG/ACT NA SUSP: 2.0000 | Freq: Every day | NASAL | 6 refills | Status: DC

## 2017-10-01 MED ORDER — LORATADINE 10 MG PO TABS: 10.0000 mg | ORAL_TABLET | Freq: Every day | ORAL | 11 refills | Status: DC

## 2017-10-01 MED ORDER — BENZONATATE 200 MG PO CAPS: 200.0000 mg | ORAL_CAPSULE | Freq: Two times a day (BID) | ORAL | 0 refills | Status: DC | PRN

## 2017-10-01 NOTE — Telephone Encounter (Signed)
Spoke with patient.  She notes postnasal drip and some cough recently.  No sinus or nasal congestion.  No fevers.  No trouble breathing.  She does have sick contacts at work.  Started 2 nights ago.  Has been coughing.  Blood pressure is 138/90.  Temperature 97.51F.  Oxygen saturation 98%.  Pulse 95.  She has been sneezing as well.  Discussed with her that symptoms were likely allergic or viral in nature.  Antibiotic would not be helpful.  We will trial Flonase and Claritin.  Tessalon for cough.  Sent to pharmacy.  Patient appears well on exam.  Normal TMs bilaterally, oropharynx with no erythema or exudate, heart is regular rate and rhythm, lungs sound clear.

## 2017-10-05 DIAGNOSIS — N736 Female pelvic peritoneal adhesions (postinfective): Secondary | ICD-10-CM | POA: Diagnosis not present

## 2017-10-05 DIAGNOSIS — N7011 Chronic salpingitis: Secondary | ICD-10-CM | POA: Diagnosis not present

## 2017-10-05 DIAGNOSIS — K219 Gastro-esophageal reflux disease without esophagitis: Secondary | ICD-10-CM | POA: Diagnosis not present

## 2017-10-05 DIAGNOSIS — F17210 Nicotine dependence, cigarettes, uncomplicated: Secondary | ICD-10-CM | POA: Diagnosis not present

## 2017-10-05 DIAGNOSIS — N888 Other specified noninflammatory disorders of cervix uteri: Secondary | ICD-10-CM | POA: Diagnosis not present

## 2017-10-05 DIAGNOSIS — R102 Pelvic and perineal pain: Secondary | ICD-10-CM | POA: Diagnosis not present

## 2017-10-05 DIAGNOSIS — K660 Peritoneal adhesions (postprocedural) (postinfection): Secondary | ICD-10-CM | POA: Diagnosis not present

## 2017-10-05 DIAGNOSIS — I10 Essential (primary) hypertension: Secondary | ICD-10-CM | POA: Diagnosis not present

## 2017-10-05 DIAGNOSIS — G8929 Other chronic pain: Secondary | ICD-10-CM | POA: Diagnosis not present

## 2017-10-05 DIAGNOSIS — N7001 Acute salpingitis: Secondary | ICD-10-CM | POA: Diagnosis not present

## 2017-10-05 DIAGNOSIS — N838 Other noninflammatory disorders of ovary, fallopian tube and broad ligament: Secondary | ICD-10-CM | POA: Diagnosis not present

## 2017-10-21 ENCOUNTER — Other Ambulatory Visit: Payer: Self-pay | Admitting: Family Medicine

## 2017-10-29 DIAGNOSIS — I10 Essential (primary) hypertension: Secondary | ICD-10-CM | POA: Diagnosis not present

## 2017-10-29 DIAGNOSIS — N898 Other specified noninflammatory disorders of vagina: Secondary | ICD-10-CM | POA: Diagnosis not present

## 2017-10-29 DIAGNOSIS — R102 Pelvic and perineal pain: Secondary | ICD-10-CM | POA: Diagnosis not present

## 2017-10-29 DIAGNOSIS — K219 Gastro-esophageal reflux disease without esophagitis: Secondary | ICD-10-CM | POA: Diagnosis not present

## 2017-10-29 DIAGNOSIS — Z87891 Personal history of nicotine dependence: Secondary | ICD-10-CM | POA: Diagnosis not present

## 2017-10-29 DIAGNOSIS — Z90710 Acquired absence of both cervix and uterus: Secondary | ICD-10-CM | POA: Diagnosis not present

## 2017-11-02 DIAGNOSIS — Z760 Encounter for issue of repeat prescription: Secondary | ICD-10-CM | POA: Diagnosis not present

## 2017-12-11 ENCOUNTER — Ambulatory Visit (INDEPENDENT_AMBULATORY_CARE_PROVIDER_SITE_OTHER): Payer: 59 | Admitting: Family Medicine

## 2017-12-11 ENCOUNTER — Encounter: Payer: Self-pay | Admitting: Family Medicine

## 2017-12-11 DIAGNOSIS — F419 Anxiety disorder, unspecified: Secondary | ICD-10-CM | POA: Diagnosis not present

## 2017-12-11 DIAGNOSIS — I10 Essential (primary) hypertension: Secondary | ICD-10-CM | POA: Diagnosis not present

## 2017-12-11 DIAGNOSIS — F411 Generalized anxiety disorder: Secondary | ICD-10-CM | POA: Insufficient documentation

## 2017-12-11 MED ORDER — ESCITALOPRAM OXALATE 10 MG PO TABS
10.0000 mg | ORAL_TABLET | Freq: Every day | ORAL | 1 refills | Status: DC
Start: 2017-12-11 — End: 2018-06-09

## 2017-12-11 NOTE — Assessment & Plan Note (Signed)
I suspect symptoms are related to anxiety and she is having tension headaches. BP normal today and suspect prior elevation was in the setting of pain and her surgery. We will start on lexapro. Discussed potential for weight gain as well as possible drowsiness or insomnia with this medication. F/u in 2-3 months for recheck.

## 2017-12-11 NOTE — Progress Notes (Signed)
  Tommi Rumps, MD Phone: [phone]  Amy Day is a 42 y.o. female who presents today for same day visit.   Notes her BP was elevated leading in to her hysterectomy. As high as 150s/90s. She has not bee checking at home. Notes some frontal headaches, though has a history of this prior to her surgery. Notes no chest pain, dyspnea, numbness, or weakness. No vertigo or vision changes. Does note anxiety related to work. Notes her heart races with the anxiety. Denies depression. Headaches improve with ibuprofen. Has a history of prior similar headaches. She had none of these symptoms when she was out of work after her surgery. Notes work is anxiety provoking.   Social History   Tobacco Use  Smoking Status Current Every Day Smoker  . Types: E-cigarettes  . Last attempt to quit: 06/26/2004  . Years since quitting: 13.4  Smokeless Tobacco Never Used  Tobacco Comment   Vaping      ROS see history of present illness  Objective  Physical Exam Vitals:   12/11/17 1007  BP: 120/80  Pulse: 70  Resp: 17  Temp: 98.3 F (36.8 C)  SpO2: 98%    BP Readings from Last 3 Encounters:  12/11/17 120/80  06/25/17 105/77  05/25/17 109/70   Wt Readings from Last 3 Encounters:  12/11/17 220 lb 8 oz (100 kg)  06/25/17 224 lb (101.6 kg)  05/25/17 224 lb 12.8 oz (102 kg)    Physical Exam  Constitutional: No distress.  HENT:  Head: Normocephalic and atraumatic.  Mouth/Throat: Oropharynx is clear and moist.  Eyes: Pupils are equal, round, and reactive to light. Conjunctivae are normal.  Cardiovascular: Normal rate, regular rhythm and normal heart sounds.  Pulmonary/Chest: Effort normal and breath sounds normal.  Musculoskeletal: She exhibits no edema.  Neurological: She is alert.  CN 2-12 intact, 5/5 strength in bilateral biceps, triceps, grip, quads, hamstrings, plantar and dorsiflexion, sensation to light touch intact in bilateral UE and LE, normal gait  Skin: Skin is warm and dry.  She is not diaphoretic.     Assessment/Plan: Please see individual problem list.  Benign essential HTN BP normal today. Was elevated when she was in pain previously and prior to her surgery. Discussed monitoring it at home. Symptoms could be BP related, though I think they are more likely related to anxiety given resolution of symptoms when she was off work. She will follow-up in 2 months.   Anxiety I suspect symptoms are related to anxiety and she is having tension headaches. BP normal today and suspect prior elevation was in the setting of pain and her surgery. We will start on lexapro. Discussed potential for weight gain as well as possible drowsiness or insomnia with this medication. F/u in 2-3 months for recheck.   No orders of the defined types were placed in this encounter.   Meds ordered this encounter  Medications  . escitalopram (LEXAPRO) 10 MG tablet    Sig: Take 1 tablet (10 mg total) by mouth daily.    Dispense:  90 tablet    Refill:  1     Tommi Rumps, MD [place]

## 2017-12-11 NOTE — Patient Instructions (Signed)
Nice to see you. Please start checking your blood pressure at home. Your goal is less than 130/80. We will start you on lexapro for anxiety.  If your anxiety worsens please let me know.

## 2017-12-11 NOTE — Assessment & Plan Note (Signed)
BP normal today. Was elevated when she was in pain previously and prior to her surgery. Discussed monitoring it at home. Symptoms could be BP related, though I think they are more likely related to anxiety given resolution of symptoms when she was off work. She will follow-up in 2 months.

## 2018-01-19 ENCOUNTER — Encounter: Payer: Self-pay | Admitting: Obstetrics and Gynecology

## 2018-01-21 ENCOUNTER — Ambulatory Visit: Payer: 59 | Admitting: Obstetrics and Gynecology

## 2018-02-11 ENCOUNTER — Ambulatory Visit (INDEPENDENT_AMBULATORY_CARE_PROVIDER_SITE_OTHER): Payer: 59 | Admitting: Obstetrics and Gynecology

## 2018-02-11 ENCOUNTER — Encounter: Payer: Self-pay | Admitting: Obstetrics and Gynecology

## 2018-02-11 VITALS — BP 134/96 | HR 60 | Ht 67.0 in | Wt 221.0 lb

## 2018-02-11 DIAGNOSIS — Z1231 Encounter for screening mammogram for malignant neoplasm of breast: Secondary | ICD-10-CM

## 2018-02-11 DIAGNOSIS — Z1239 Encounter for other screening for malignant neoplasm of breast: Secondary | ICD-10-CM

## 2018-02-11 DIAGNOSIS — Z01419 Encounter for gynecological examination (general) (routine) without abnormal findings: Secondary | ICD-10-CM | POA: Diagnosis not present

## 2018-02-11 DIAGNOSIS — F5101 Primary insomnia: Secondary | ICD-10-CM

## 2018-02-11 NOTE — Patient Instructions (Signed)
I value your feedback and entrusting us with your care. If you get a [facility] patient survey, I would appreciate you taking the time to let us know about your experience today. Thank you! 

## 2018-02-11 NOTE — Progress Notes (Signed)
Chief Complaint  Patient presents with  . Gynecologic Exam    trouble sleeping     HPI:      [name] is a 42 y.o. [id number] who LMP was No LMP recorded. Patient has had a hysterectomy., presents today for her annual examination. Her menses are absent due to lap hyst/bilat salpingectomy for RLQ pain 2/19. Dysmenorrhea none. She does not have intermenstrual bleeding.   Sex activity: single partner, contraception - tubal ligation.  Last Pap: June 05, 2015  Results were: no abnormalities /neg HPV DNA   Last mammogram: 04/16/17; normal results There is a FH of breast cancer in her PGM, genetic testing not indicated. There is no FH of ovarian cancer. The patient does do self-breast exams.  Tobacco use: The patient denies current or previous tobacco use. Alcohol use: nightly 1 glass of wine Exercise: not active  She does not get adequate calcium and Vitamin D in her diet.  She has labs with her PCP.  She is having issues with insomnia the past few months. Pt can fall asleep but wakes up nightly at 2 AM and can't fall back asleep. No triggers she can identify wake her up. She drinks 1 cup coffee in AM only, no caffeine after 12. She drinks a glass of wine nightly. Not exercising.    Past Medical History:  Diagnosis Date  . Anxiety   . GERD (gastroesophageal reflux disease)   . Hypertension     Past Surgical History:  Procedure Laterality Date  . ABDOMINAL HYSTERECTOMY     RLQ pain; at [facility]  . ABLATION  2013  . BREAST REDUCTION SURGERY    . BREAST SURGERY  2012   Breast Reduction  . COLONOSCOPY WITH PROPOFOL N/A 06/25/2017   Procedure: COLONOSCOPY WITH PROPOFOL;  Surgeon: Lin Landsman, MD;  Location: [facility] ENDOSCOPY;  Service: Gastroenterology;  Laterality: N/A;  . REDUCTION MAMMAPLASTY     followed by breast lift  . TUBAL LIGATION  2006    Family History  Problem Relation Age of Onset  . Diabetes Father   . Heart attack Father   . COPD Paternal  Grandmother   . Breast cancer Paternal Grandmother 3    Social History   Socioeconomic History  . Marital status: Married    Spouse name: Not on file  . Number of children: Not on file  . Years of education: Not on file  . Highest education level: Not on file  Occupational History  . Not on file  Social Needs  . Financial resource strain: Not on file  . Food insecurity:    Worry: Not on file    Inability: Not on file  . Transportation needs:    Medical: Not on file    Non-medical: Not on file  Tobacco Use  . Smoking status: Current Every Day Smoker    Types: E-cigarettes    Last attempt to quit: 06/26/2004    Years since quitting: 13.6  . Smokeless tobacco: Never Used  . Tobacco comment: Vaping   Substance and Sexual Activity  . Alcohol use: Yes    Alcohol/week: 0.0 oz    Comment: Rare   . Drug use: No  . Sexual activity: Yes    Partners: Male    Birth control/protection: Surgical    Comment: Husband   Lifestyle  . Physical activity:    Days per week: Not on file    Minutes per session: Not on file  . Stress:  Not on file  Relationships  . Social connections:    Talks on phone: Not on file    Gets together: Not on file    Attends religious service: Not on file    Active member of club or organization: Not on file    Attends meetings of clubs or organizations: Not on file    Relationship status: Not on file  . Intimate partner violence:    Fear of current or ex partner: Not on file    Emotionally abused: Not on file    Physically abused: Not on file    Forced sexual activity: Not on file  Other Topics Concern  . Not on file  Social History Narrative   Married    Employed at [employer]    Some college- highest level    Caffeine- Coffee, tea, and occasional soda     Current Outpatient Medications on File Prior to Visit  Medication Sig Dispense Refill  . acetaminophen (TYLENOL) 325 MG tablet Take 2 tablets by mouth as needed.    Marland Kitchen  escitalopram (LEXAPRO) 10 MG tablet Take 1 tablet (10 mg total) by mouth daily. 90 tablet 1  . fluticasone (FLONASE) 50 MCG/ACT nasal spray Place 2 sprays into both nostrils daily. 16 g 6  . loratadine (CLARITIN) 10 MG tablet Take 1 tablet (10 mg total) by mouth daily. 30 tablet 11  . losartan (COZAAR) 50 MG tablet TAKE 1 TABLET (50 MG TOTAL) BY MOUTH DAILY. 90 tablet 1  . meloxicam (MOBIC) 15 MG tablet Take 1 tablet (15 mg total) by mouth daily as needed for pain. 60 tablet 0  . omeprazole (PRILOSEC) 40 MG capsule TAKE 1 CAPSULE (40 MG TOTAL) BY MOUTH DAILY. 90 capsule 3   No current facility-administered medications on file prior to visit.       ROS:  Review of Systems  Constitutional: Negative for fatigue, fever and unexpected weight change.  Respiratory: Negative for cough, shortness of breath and wheezing.   Cardiovascular: Negative for chest pain, palpitations and leg swelling.  Gastrointestinal: Negative for blood in stool, constipation, diarrhea, nausea and vomiting.  Endocrine: Negative for cold intolerance, heat intolerance and polyuria.  Genitourinary: Negative for dyspareunia, dysuria, flank pain, frequency, genital sores, hematuria, menstrual problem, pelvic pain, urgency, vaginal bleeding, vaginal discharge and vaginal pain.  Musculoskeletal: Negative for back pain, joint swelling and myalgias.  Skin: Negative for rash.  Neurological: Negative for dizziness, syncope, light-headedness, numbness and headaches.  Hematological: Negative for adenopathy.  Psychiatric/Behavioral: Negative for agitation, confusion, sleep disturbance and suicidal ideas. The patient is not nervous/anxious.      Objective: BP (!) 134/96   Pulse 60   Ht 5\' 7"  (1.702 m)   Wt 221 lb (100.2 kg)   BMI 34.61 kg/m    Physical Exam  Constitutional: She is oriented to person, place, and time. She appears well-developed and well-nourished.  Genitourinary: Vagina normal. There is no rash or tenderness  on the right labia. There is no rash or tenderness on the left labia. No erythema or tenderness in the vagina. No vaginal discharge found. Right adnexum does not display mass and does not display tenderness. Left adnexum does not display mass and does not display tenderness.  Genitourinary Comments: UTERUS/CX SURG REM  Neck: Normal range of motion. No thyromegaly present.  Cardiovascular: Normal rate, regular rhythm and normal heart sounds.  No murmur heard. Pulmonary/Chest: Effort normal and breath sounds normal. Right breast exhibits no mass, no nipple discharge, no  skin change and no tenderness. Left breast exhibits no mass, no nipple discharge, no skin change and no tenderness.  Abdominal: Soft. There is no tenderness. There is no guarding.  Musculoskeletal: Normal range of motion.  Neurological: She is alert and oriented to person, place, and time. No cranial nerve deficit.  Psychiatric: She has a normal mood and affect. Her behavior is normal.  Vitals reviewed.   Assessment/Plan: Encounter for annual routine gynecological examination  Screening for breast cancer - Pt to sched mammo. - Plan: MM DIGITAL SCREENING BILATERAL  Primary insomnia - D/C wine at night/add exercise. See if sx improve.        GYN counsel breast self exam, mammography screening, adequate intake of calcium and vitamin D, diet and exercise     F/U  Return in about 1 year (around 02/12/2019).  Ellery Tash B. Emilya Justen, PA-C 02/11/2018 9:24 AM

## 2018-02-24 ENCOUNTER — Other Ambulatory Visit: Payer: Self-pay | Admitting: Family Medicine

## 2018-02-24 DIAGNOSIS — E669 Obesity, unspecified: Secondary | ICD-10-CM

## 2018-02-24 DIAGNOSIS — I10 Essential (primary) hypertension: Secondary | ICD-10-CM

## 2018-03-03 ENCOUNTER — Other Ambulatory Visit (INDEPENDENT_AMBULATORY_CARE_PROVIDER_SITE_OTHER): Payer: 59

## 2018-03-03 DIAGNOSIS — E669 Obesity, unspecified: Secondary | ICD-10-CM | POA: Diagnosis not present

## 2018-03-03 DIAGNOSIS — I10 Essential (primary) hypertension: Secondary | ICD-10-CM | POA: Diagnosis not present

## 2018-03-03 LAB — COMPREHENSIVE METABOLIC PANEL
ALT: 41 U/L — ABNORMAL HIGH (ref 0–35)
AST: 22 U/L (ref 0–37)
Albumin: 4 g/dL (ref 3.5–5.2)
Alkaline Phosphatase: 145 U/L — ABNORMAL HIGH (ref 39–117)
BUN: 14 mg/dL (ref 6–23)
CO2: 27 mEq/L (ref 19–32)
Calcium: 9.1 mg/dL (ref 8.4–10.5)
Chloride: 105 mEq/L (ref 96–112)
Creatinine, Ser: 0.9 mg/dL (ref 0.40–1.20)
GFR: 73.13 mL/min (ref >60.00)
Glucose, Bld: 106 mg/dL — ABNORMAL HIGH (ref 70–99)
Potassium: 4.5 mEq/L (ref 3.5–5.1)
Sodium: 140 mEq/L (ref 135–145)
Total Bilirubin: 0.4 mg/dL (ref 0.2–1.2)
Total Protein: 6.4 g/dL (ref 6.0–8.3)

## 2018-03-03 LAB — CBC
HCT: 44.8 % (ref 36.0–46.0)
Hemoglobin: 15.4 g/dL — ABNORMAL HIGH (ref 12.0–15.0)
MCHC: 34.4 g/dL (ref 30.0–36.0)
MCV: 96 fl (ref 78.0–100.0)
Platelets: 287 10*3/uL (ref 150.0–400.0)
RBC: 4.67 Mil/uL (ref 3.87–5.11)
RDW: 13.3 % (ref 11.5–15.5)
WBC: 7.4 10*3/uL (ref 4.0–10.5)

## 2018-03-03 LAB — LIPID PANEL
Cholesterol: 200 mg/dL (ref 0–200)
HDL: 56.2 mg/dL (ref >39.00)
LDL Cholesterol: 127 mg/dL — ABNORMAL HIGH (ref 0–99)
NonHDL: 144.1
Total CHOL/HDL Ratio: 4
Triglycerides: 87 mg/dL (ref 0.0–149.0)
VLDL: 17.4 mg/dL (ref 0.0–40.0)

## 2018-03-03 LAB — HEMOGLOBIN A1C: Hgb A1c MFr Bld: 5.4 % (ref 4.6–6.5)

## 2018-03-03 LAB — TSH: TSH: 2.31 u[IU]/mL (ref 0.35–4.50)

## 2018-03-08 ENCOUNTER — Telehealth: Payer: Self-pay

## 2018-03-08 DIAGNOSIS — D582 Other hemoglobinopathies: Secondary | ICD-10-CM

## 2018-03-08 DIAGNOSIS — R945 Abnormal results of liver function studies: Principal | ICD-10-CM

## 2018-03-08 DIAGNOSIS — R7989 Other specified abnormal findings of blood chemistry: Secondary | ICD-10-CM

## 2018-03-08 NOTE — Telephone Encounter (Signed)
-----   Message from Leone Haven, MD sent at 03/06/2018 10:15 AM EDT ----- Please see my prior message and place orders and get the patient scheduled for follow-up labs. Thanks.

## 2018-03-17 ENCOUNTER — Other Ambulatory Visit (INDEPENDENT_AMBULATORY_CARE_PROVIDER_SITE_OTHER): Payer: 59

## 2018-03-17 DIAGNOSIS — D582 Other hemoglobinopathies: Secondary | ICD-10-CM | POA: Diagnosis not present

## 2018-03-17 DIAGNOSIS — R945 Abnormal results of liver function studies: Secondary | ICD-10-CM | POA: Diagnosis not present

## 2018-03-17 DIAGNOSIS — R7989 Other specified abnormal findings of blood chemistry: Secondary | ICD-10-CM

## 2018-03-17 LAB — HEPATIC FUNCTION PANEL
ALT: 27 U/L (ref 0–35)
AST: 24 U/L (ref 0–37)
Albumin: 4.3 g/dL (ref 3.5–5.2)
Alkaline Phosphatase: 112 U/L (ref 39–117)
Bilirubin, Direct: 0.1 mg/dL (ref 0.0–0.3)
Total Bilirubin: 0.7 mg/dL (ref 0.2–1.2)
Total Protein: 7.4 g/dL (ref 6.0–8.3)

## 2018-03-17 LAB — CBC
HCT: 50.3 % — ABNORMAL HIGH (ref 36.0–46.0)
Hemoglobin: 16.7 g/dL — ABNORMAL HIGH (ref 12.0–15.0)
MCHC: 33.2 g/dL (ref 30.0–36.0)
MCV: 98.6 fl (ref 78.0–100.0)
Platelets: 262 10*3/uL (ref 150.0–400.0)
RBC: 5.1 Mil/uL (ref 3.87–5.11)
RDW: 13.5 % (ref 11.5–15.5)
WBC: 8 10*3/uL (ref 4.0–10.5)

## 2018-03-19 ENCOUNTER — Encounter: Payer: Self-pay | Admitting: Family Medicine

## 2018-03-19 DIAGNOSIS — D582 Other hemoglobinopathies: Secondary | ICD-10-CM

## 2018-03-22 NOTE — Telephone Encounter (Signed)
Discussed in office with patient. She noted she vapes. Discussed that smoking can sometimes contribute. Discussed primary RBC issues can cause this. She opted to recheck this again prior to referral to hematology. Order placed.

## 2018-03-23 ENCOUNTER — Other Ambulatory Visit: Payer: Self-pay | Admitting: Family Medicine

## 2018-03-23 ENCOUNTER — Other Ambulatory Visit (INDEPENDENT_AMBULATORY_CARE_PROVIDER_SITE_OTHER): Payer: 59

## 2018-03-23 DIAGNOSIS — D582 Other hemoglobinopathies: Secondary | ICD-10-CM

## 2018-03-23 LAB — CBC
HCT: 48 % — ABNORMAL HIGH (ref 36.0–46.0)
Hemoglobin: 16.4 g/dL — ABNORMAL HIGH (ref 12.0–15.0)
MCHC: 34.2 g/dL (ref 30.0–36.0)
MCV: 96.6 fl (ref 78.0–100.0)
Platelets: 245 10*3/uL (ref 150.0–400.0)
RBC: 4.97 Mil/uL (ref 3.87–5.11)
RDW: 13.2 % (ref 11.5–15.5)
WBC: 9.4 10*3/uL (ref 4.0–10.5)

## 2018-04-01 ENCOUNTER — Ambulatory Visit
Admission: RE | Admit: 2018-04-01 | Discharge: 2018-04-01 | Disposition: A | Discharge status: Home / Self Care | Payer: 59 | Source: Ambulatory Visit | Attending: Oncology | Admitting: Oncology

## 2018-04-01 ENCOUNTER — Encounter: Payer: Self-pay | Admitting: Oncology

## 2018-04-01 ENCOUNTER — Inpatient Hospital Stay: Payer: 59

## 2018-04-01 ENCOUNTER — Inpatient Hospital Stay: Payer: 59 | Attending: Oncology | Admitting: Oncology

## 2018-04-01 VITALS — BP 134/96 | HR 74 | Temp 97.0°F | Resp 18 | Ht 67.0 in | Wt 223.2 lb

## 2018-04-01 DIAGNOSIS — D45 Polycythemia vera: Secondary | ICD-10-CM | POA: Diagnosis not present

## 2018-04-01 DIAGNOSIS — D751 Secondary polycythemia: Secondary | ICD-10-CM | POA: Insufficient documentation

## 2018-04-01 DIAGNOSIS — Z803 Family history of malignant neoplasm of breast: Secondary | ICD-10-CM | POA: Diagnosis not present

## 2018-04-01 DIAGNOSIS — Z720 Tobacco use: Secondary | ICD-10-CM | POA: Insufficient documentation

## 2018-04-01 LAB — URINALYSIS, COMPLETE (UACMP) WITH MICROSCOPIC
Bacteria, UA: NONE SEEN
Bilirubin Urine: NEGATIVE
Glucose, UA: NEGATIVE mg/dL
Hgb urine dipstick: NEGATIVE
Ketones, ur: NEGATIVE mg/dL
Leukocytes, UA: NEGATIVE
Nitrite: NEGATIVE
Protein, ur: NEGATIVE mg/dL
Specific Gravity, Urine: 1.009 (ref 1.005–1.030)
pH: 6 (ref 5.0–8.0)

## 2018-04-01 LAB — CBC WITH DIFFERENTIAL/PLATELET
Basophils Absolute: 0.1 10*3/uL (ref 0–0.1)
Basophils Relative: 1 %
Eosinophils Absolute: 0.3 10*3/uL (ref 0–0.7)
Eosinophils Relative: 3 %
HCT: 47.3 % — ABNORMAL HIGH (ref 35.0–47.0)
Hemoglobin: 16.2 g/dL — ABNORMAL HIGH (ref 12.0–16.0)
Lymphocytes Relative: 20 %
Lymphs Abs: 2.1 10*3/uL (ref 1.0–3.6)
MCH: 33.1 pg (ref 26.0–34.0)
MCHC: 34.3 g/dL (ref 32.0–36.0)
MCV: 96.4 fL (ref 80.0–100.0)
Monocytes Absolute: 0.7 10*3/uL (ref 0.2–0.9)
Monocytes Relative: 7 %
Neutro Abs: 7.2 10*3/uL — ABNORMAL HIGH (ref 1.4–6.5)
Neutrophils Relative %: 69 %
Platelets: 295 10*3/uL (ref 150–440)
RBC: 4.91 MIL/uL (ref 3.80–5.20)
RDW: 13.2 % (ref 11.5–14.5)
WBC: 10.4 10*3/uL (ref 3.6–11.0)

## 2018-04-01 NOTE — Progress Notes (Signed)
Hematology/Oncology Consult note [facility] [phone] [phone]  Patient Care Team: Leone Haven, MD as PCP - General (Family Medicine)   Name of the patient: Amy Day  [id number]  09-01-1976    Reason for referral-elevated hemoglobin   Referring physician-Dr. Caryl Bis  Date of visit: 04/01/18   History of presenting illness-patient is a 42 year old female who has been referred to [place] for polycythemia.  She used to smoke in the past but quit smoking 30 years ago.  However she did restart smoking about 2 weeks ago and currently smokes 3 cigarettes a day.  Recent CBC done 9 days ago showed H&H of 16.4/48.  White count platelet count was normal.  Her hemoglobin has been 16.4 and 16.7 on 2 separate occasions.  She denies any chronic lung disease.  Denies any blood in her urine.  Her appetite is good and she denies any unintentional weight loss.  She reports getting a good night sleep and does not wake up multiple times in the night.  Most of the time she does wake up feeling refreshed.  She denies any specific complaints today  ECOG PS- 0  Pain scale- 0   Review of systems- Review of Systems  Constitutional: Negative for chills, fever, malaise/fatigue and weight loss.  HENT: Negative for congestion, ear discharge and nosebleeds.   Eyes: Negative for blurred vision.  Respiratory: Negative for cough, hemoptysis, sputum production, shortness of breath and wheezing.   Cardiovascular: Negative for chest pain, palpitations, orthopnea and claudication.  Gastrointestinal: Negative for abdominal pain, blood in stool, constipation, diarrhea, heartburn, melena, nausea and vomiting.  Genitourinary: Negative for dysuria, flank pain, frequency, hematuria and urgency.  Musculoskeletal: Negative for back pain, joint pain and myalgias.  Skin: Negative for rash.  Neurological: Negative for dizziness, tingling, focal weakness, seizures, weakness  and headaches.  Endo/Heme/Allergies: Does not bruise/bleed easily.  Psychiatric/Behavioral: Negative for depression and suicidal ideas. The patient does not have insomnia.     No Known Allergies  Patient Active Problem List   Diagnosis Date Noted  . Anxiety 12/11/2017  . Abdominal pain, chronic, right lower quadrant 08/12/2016  . Palpitations 12/11/2015  . Obesity 06/27/2015  . Benign essential HTN 06/27/2015  . GERD (gastroesophageal reflux disease) 06/27/2015     Past Medical History:  Diagnosis Date  . Anxiety   . GERD (gastroesophageal reflux disease)   . Hypertension      Past Surgical History:  Procedure Laterality Date  . ABDOMINAL HYSTERECTOMY     RLQ pain; at [facility]  . ABLATION  2013  . BREAST REDUCTION SURGERY    . BREAST SURGERY  2012   Breast Reduction  . COLONOSCOPY WITH PROPOFOL N/A 06/25/2017   Procedure: COLONOSCOPY WITH PROPOFOL;  Surgeon: Lin Landsman, MD;  Location: [facility] ENDOSCOPY;  Service: Gastroenterology;  Laterality: N/A;  . REDUCTION MAMMAPLASTY     followed by breast lift  . TUBAL LIGATION  2006    Social History   Socioeconomic History  . Marital status: Married    Spouse name: Not on file  . Number of children: Not on file  . Years of education: Not on file  . Highest education level: Not on file  Occupational History  . Not on file  Social Needs  . Financial resource strain: Not on file  . Food insecurity:    Worry: Not on file    Inability: Not on file  . Transportation needs:    Medical: Not on file  Non-medical: Not on file  Tobacco Use  . Smoking status: Current Every Day Smoker    Types: E-cigarettes    Last attempt to quit: 06/26/2004    Years since quitting: 13.7  . Smokeless tobacco: Never Used  . Tobacco comment: Vaping   Substance and Sexual Activity  . Alcohol use: Yes    Alcohol/week: 0.0 oz    Comment: Rare   . Drug use: No  . Sexual activity: Yes    Partners: Male    Birth control/protection:  Surgical    Comment: Husband   Lifestyle  . Physical activity:    Days per week: Not on file    Minutes per session: Not on file  . Stress: Not on file  Relationships  . Social connections:    Talks on phone: Not on file    Gets together: Not on file    Attends religious service: Not on file    Active member of club or organization: Not on file    Attends meetings of clubs or organizations: Not on file    Relationship status: Not on file  . Intimate partner violence:    Fear of current or ex partner: Not on file    Emotionally abused: Not on file    Physically abused: Not on file    Forced sexual activity: Not on file  Other Topics Concern  . Not on file  Social History Narrative   Married    Employed at [employer]    Some college- highest level    Caffeine- Coffee, tea, and occasional soda      Family History  Problem Relation Age of Onset  . Diabetes Father   . Heart attack Father   . COPD Paternal Grandmother   . Breast cancer Paternal Grandmother 64     Current Outpatient Medications:  .  escitalopram (LEXAPRO) 10 MG tablet, Take 1 tablet (10 mg total) by mouth daily., Disp: 90 tablet, Rfl: 1 .  losartan (COZAAR) 50 MG tablet, TAKE 1 TABLET (50 MG TOTAL) BY MOUTH DAILY., Disp: 90 tablet, Rfl: 1 .  omeprazole (PRILOSEC) 40 MG capsule, TAKE 1 CAPSULE (40 MG TOTAL) BY MOUTH DAILY., Disp: 90 capsule, Rfl: 3 .  acetaminophen (TYLENOL) 325 MG tablet, Take 2 tablets by mouth as needed., Disp: , Rfl:  .  fluticasone (FLONASE) 50 MCG/ACT nasal spray, Place 2 sprays into both nostrils daily. (Patient not taking: Reported on 04/01/2018), Disp: 16 g, Rfl: 6 .  loratadine (CLARITIN) 10 MG tablet, Take 1 tablet (10 mg total) by mouth daily. (Patient not taking: Reported on 04/01/2018), Disp: 30 tablet, Rfl: 11 .  meloxicam (MOBIC) 15 MG tablet, Take 1 tablet (15 mg total) by mouth daily as needed for pain. (Patient not taking: Reported on 04/01/2018), Disp: 60 tablet,  Rfl: 0   Physical exam:  Vitals:   04/01/18 1334  BP: (!) 134/96  Pulse: 74  Resp: 18  Temp: (!) 97 F (36.1 C)  TempSrc: Tympanic  SpO2: 98%  Weight: 223 lb 3.2 oz (101.2 kg)  Height: 5\' 7"  (1.702 m)   Physical Exam  Constitutional: She is oriented to person, place, and time. She appears well-developed and well-nourished.  HENT:  Head: Normocephalic and atraumatic.  Eyes: Pupils are equal, round, and reactive to light. EOM are normal.  Neck: Normal range of motion.  Cardiovascular: Normal rate, regular rhythm and normal heart sounds.  Pulmonary/Chest: Effort normal and breath sounds normal.  Abdominal: Soft. Bowel sounds are  normal.  No palpable splenomegaly  Neurological: She is alert and oriented to person, place, and time.  Skin: Skin is warm and dry.       CMP Latest Ref Rng & Units 03/17/2018  Glucose 70 - 99 mg/dL -  BUN 6 - 23 mg/dL -  Creatinine 0.40 - 1.20 mg/dL -  Sodium 135 - 145 mEq/L -  Potassium 3.5 - 5.1 mEq/L -  Chloride 96 - 112 mEq/L -  CO2 19 - 32 mEq/L -  Calcium 8.4 - 10.5 mg/dL -  Total Protein 6.0 - 8.3 g/dL 7.4  Total Bilirubin 0.2 - 1.2 mg/dL 0.7  Alkaline Phos 39 - 117 U/L 112  AST 0 - 37 U/L 24  ALT 0 - 35 U/L 27   CBC Latest Ref Rng & Units 04/01/2018  WBC 3.6 - 11.0 K/uL 10.4  Hemoglobin 12.0 - 16.0 g/dL 16.2(H)  Hematocrit 35.0 - 47.0 % 47.3(H)  Platelets 150 - 440 K/uL 295     Assessment and plan- Patient is a 42 y.o. female referred for polycythemia  Patient has mild polycythemia as her hemoglobin has been greater than 16 on 2 different occasions.  Discussed primary versus secondary polycythemia.  It is possible the patient has secondary polycythemia from her smoking.  She does not have any signs and symptoms of obstructive sleep apnea.  However I would like to get a CBC with differential, CMP, chest x-ray, urinalysis, Jak 2 mutation testing and EPO testing today.  I will see the patient back in 2 weeks time to discuss the results  of her blood work and further management.  She does not have any stigmata of chronic lung disease and her oxygen saturation is 98% on room air.  She also underwent a CT abdomen in 2018 which did not reveal any evidence of malignancy   Thank you for this kind referral and the opportunity to participate in the care of this patient   Visit Diagnosis 1. Polycythemia     Dr. Randa Evens, MD, MPH [facility] [phone] 04/01/2018 3:53 PM

## 2018-04-02 LAB — ERYTHROPOIETIN: Erythropoietin: 4.1 m[IU]/mL (ref 2.6–18.5)

## 2018-04-05 LAB — JAK2 GENOTYPR

## 2018-04-15 ENCOUNTER — Encounter: Payer: Self-pay | Admitting: Oncology

## 2018-04-15 ENCOUNTER — Inpatient Hospital Stay (HOSPITAL_BASED_OUTPATIENT_CLINIC_OR_DEPARTMENT_OTHER): Payer: 59 | Admitting: Oncology

## 2018-04-15 VITALS — BP 124/83 | HR 78 | Temp 97.3°F | Resp 18 | Ht 67.0 in | Wt 223.1 lb

## 2018-04-15 DIAGNOSIS — D751 Secondary polycythemia: Secondary | ICD-10-CM | POA: Diagnosis not present

## 2018-04-15 DIAGNOSIS — Z720 Tobacco use: Secondary | ICD-10-CM | POA: Diagnosis not present

## 2018-04-15 DIAGNOSIS — Z803 Family history of malignant neoplasm of breast: Secondary | ICD-10-CM | POA: Diagnosis not present

## 2018-04-16 NOTE — Progress Notes (Signed)
Hematology/Oncology Consult note [facility]  Telephone:([phone] Fax:([phone]  Patient Care Team: Leone Haven, MD as PCP - General (Family Medicine)   Name of the patient: Amy Day  [id number]  [birth year]   Date of visit: 04/16/18  Diagnosis-polycythemia likely secondary  Chief complaint/ Reason for visit-discuss results of blood work  Heme/Onc history: patient is a 42 year old female who has been referred to [place] for polycythemia.  She used to smoke in the past but quit smoking 30 years ago.  However she did restart smoking about 2 weeks ago and currently smokes 3 cigarettes a day.  Recent CBC done 9 days ago showed H&H of 16.4/48.  White count platelet count was normal.  Her hemoglobin has been 16.4 and 16.7 on 2 separate occasions.  She denies any chronic lung disease.  Denies any blood in her urine.  Her appetite is good and she denies any unintentional weight loss.  She reports getting a good night sleep and does not wake up multiple times in the night.  Most of the time she does wake up feeling refreshed.  Results of blood work from 03/23/2018 were as follows: CBC showed white count of 9.4, H&H of 16.4/48 and a platelet count of 245.Jak 2 mutation testing was negative.  E Po level was normal at 4.1.  Urinalysis did not reveal any evidence of hematuria.  Chest x-ray showed no active cardiopulmonary disease.  Interval history-she reports feeling well today and denies any specific complaints  ECOG PS- 0 Pain scale- 0 Opioid associated constipation- no  Review of systems- Review of Systems  Constitutional: Negative for chills, fever, malaise/fatigue and weight loss.  HENT: Negative for congestion, ear discharge and nosebleeds.   Eyes: Negative for blurred vision.  Respiratory: Negative for cough, hemoptysis, sputum production, shortness of breath and wheezing.   Cardiovascular: Negative for chest pain, palpitations, orthopnea and  claudication.  Gastrointestinal: Negative for abdominal pain, blood in stool, constipation, diarrhea, heartburn, melena, nausea and vomiting.  Genitourinary: Negative for dysuria, flank pain, frequency, hematuria and urgency.  Musculoskeletal: Negative for back pain, joint pain and myalgias.  Skin: Negative for rash.  Neurological: Negative for dizziness, tingling, focal weakness, seizures, weakness and headaches.  Endo/Heme/Allergies: Does not bruise/bleed easily.  Psychiatric/Behavioral: Negative for depression and suicidal ideas. The patient does not have insomnia.       No Known Allergies   Past Medical History:  Diagnosis Date  . Anxiety   . GERD (gastroesophageal reflux disease)   . Hypertension      Past Surgical History:  Procedure Laterality Date  . ABDOMINAL HYSTERECTOMY     RLQ pain; at [facility]  . ABLATION  2013  . BREAST REDUCTION SURGERY    . BREAST SURGERY  2012   Breast Reduction  . COLONOSCOPY WITH PROPOFOL N/A 06/25/2017   Procedure: COLONOSCOPY WITH PROPOFOL;  Surgeon: Lin Landsman, MD;  Location: [facility] ENDOSCOPY;  Service: Gastroenterology;  Laterality: N/A;  . REDUCTION MAMMAPLASTY     followed by breast lift  . TUBAL LIGATION  2006    Social History   Socioeconomic History  . Marital status: Married    Spouse name: Not on file  . Number of children: Not on file  . Years of education: Not on file  . Highest education level: Not on file  Occupational History  . Not on file  Social Needs  . Financial resource strain: Not on file  . Food insecurity:    Worry:  Not on file    Inability: Not on file  . Transportation needs:    Medical: Not on file    Non-medical: Not on file  Tobacco Use  . Smoking status: Current Every Day Smoker    Types: E-cigarettes    Last attempt to quit: 06/26/2004    Years since quitting: 13.8  . Smokeless tobacco: Never Used  . Tobacco comment: Vaping   Substance and Sexual Activity  . Alcohol use: Yes     Alcohol/week: 0.0 standard drinks    Comment: Rare   . Drug use: No  . Sexual activity: Yes    Partners: Male    Birth control/protection: Surgical    Comment: Husband   Lifestyle  . Physical activity:    Days per week: Not on file    Minutes per session: Not on file  . Stress: Not on file  Relationships  . Social connections:    Talks on phone: Not on file    Gets together: Not on file    Attends religious service: Not on file    Active member of club or organization: Not on file    Attends meetings of clubs or organizations: Not on file    Relationship status: Not on file  . Intimate partner violence:    Fear of current or ex partner: Not on file    Emotionally abused: Not on file    Physically abused: Not on file    Forced sexual activity: Not on file  Other Topics Concern  . Not on file  Social History Narrative   Married    Employed at [employer]    Some college- highest level    Caffeine- Coffee, tea, and occasional soda     Family History  Problem Relation Age of Onset  . Diabetes Father   . Heart attack Father   . COPD Paternal Grandmother   . Breast cancer Paternal Grandmother 77     Current Outpatient Medications:  .  escitalopram (LEXAPRO) 10 MG tablet, Take 1 tablet (10 mg total) by mouth daily., Disp: 90 tablet, Rfl: 1 .  losartan (COZAAR) 50 MG tablet, TAKE 1 TABLET (50 MG TOTAL) BY MOUTH DAILY., Disp: 90 tablet, Rfl: 1 .  omeprazole (PRILOSEC) 40 MG capsule, TAKE 1 CAPSULE (40 MG TOTAL) BY MOUTH DAILY., Disp: 90 capsule, Rfl: 3 .  acetaminophen (TYLENOL) 325 MG tablet, Take 2 tablets by mouth as needed., Disp: , Rfl:  .  fluticasone (FLONASE) 50 MCG/ACT nasal spray, Place 2 sprays into both nostrils daily. (Patient not taking: Reported on 04/01/2018), Disp: 16 g, Rfl: 6 .  loratadine (CLARITIN) 10 MG tablet, Take 1 tablet (10 mg total) by mouth daily. (Patient not taking: Reported on 04/01/2018), Disp: 30 tablet, Rfl: 11 .  meloxicam  (MOBIC) 15 MG tablet, Take 1 tablet (15 mg total) by mouth daily as needed for pain. (Patient not taking: Reported on 04/01/2018), Disp: 60 tablet, Rfl: 0  Physical exam:  Vitals:   04/15/18 1520  BP: 124/83  Pulse: 78  Resp: 18  Temp: (!) 97.3 F (36.3 C)  TempSrc: Tympanic  SpO2: 98%  Weight: 223 lb 1.6 oz (101.2 kg)  Height: 5\' 7"  (1.702 m)   Physical Exam  Constitutional: She is oriented to person, place, and time. She appears well-developed and well-nourished.  HENT:  Head: Normocephalic and atraumatic.  Eyes: Pupils are equal, round, and reactive to light. EOM are normal.  Neck: Normal range of motion.  Cardiovascular:  Normal rate, regular rhythm and normal heart sounds.  Pulmonary/Chest: Effort normal and breath sounds normal.  Abdominal: Soft. Bowel sounds are normal.  Neurological: She is alert and oriented to person, place, and time.  Skin: Skin is warm and dry.     CMP Latest Ref Rng & Units 03/17/2018  Glucose 70 - 99 mg/dL -  BUN 6 - 23 mg/dL -  Creatinine 0.40 - 1.20 mg/dL -  Sodium 135 - 145 mEq/L -  Potassium 3.5 - 5.1 mEq/L -  Chloride 96 - 112 mEq/L -  CO2 19 - 32 mEq/L -  Calcium 8.4 - 10.5 mg/dL -  Total Protein 6.0 - 8.3 g/dL 7.4  Total Bilirubin 0.2 - 1.2 mg/dL 0.7  Alkaline Phos 39 - 117 U/L 112  AST 0 - 37 U/L 24  ALT 0 - 35 U/L 27   CBC Latest Ref Rng & Units 04/01/2018  WBC 3.6 - 11.0 K/uL 10.4  Hemoglobin 12.0 - 16.0 g/dL 16.2(H)  Hematocrit 35.0 - 47.0 % 47.3(H)  Platelets 150 - 440 K/uL 295    No images are attached to the encounter.  Dg Chest 2 View  Result Date: 04/01/2018 CLINICAL DATA:  Polycythemia EXAM: CHEST - 2 VIEW COMPARISON:  None. FINDINGS: Normal heart size. Lungs clear. No pneumothorax. No pleural effusion. IMPRESSION: No active cardiopulmonary disease. Electronically Signed   By: Marybelle Killings M.D.   On: 04/01/2018 17:13     Assessment and plan- Patient is a 42 y.o. female referred for polycythemia likely secondary  Her  Jak 2 mutation testing was negative and EPO level was normal.  This argues against polycythemia vera.  Urinalysis did not reveal any hematuria and the normal E Po levels also argue against tumor associated polycythemia.    Her hemoglobin is mildly elevated at 16.2 when I suspect this is secondary probably due to her smoking which she has restarted recently.  I have again encouraged her to use quit smoking altogether at this time.  If her hemoglobin levels remain elevated despite quitting smoking looking into other secondary causes of polycythemia such as obstructive sleep apnea may be warranted.  Patient does not require a phlebotomy at this time unless her hematocrit is greater than 55 given that this is a secondary cause.  I will see her back in 6 months with a repeat CBC   Visit Diagnosis 1. Secondary polycythemia      Dr. Randa Evens, MD, MPH [facility] at [facility] [phone] 04/16/2018 2:15 PM

## 2018-04-20 ENCOUNTER — Other Ambulatory Visit: Payer: Self-pay | Admitting: Family Medicine

## 2018-06-09 ENCOUNTER — Other Ambulatory Visit: Payer: Self-pay | Admitting: Family Medicine

## 2018-07-22 DIAGNOSIS — H5203 Hypermetropia, bilateral: Secondary | ICD-10-CM | POA: Diagnosis not present

## 2018-09-10 ENCOUNTER — Encounter: Payer: Self-pay | Admitting: Family Medicine

## 2018-09-10 ENCOUNTER — Ambulatory Visit (INDEPENDENT_AMBULATORY_CARE_PROVIDER_SITE_OTHER): Payer: No Typology Code available for payment source | Admitting: Family Medicine

## 2018-09-10 VITALS — BP 114/80 | HR 78 | Temp 97.7°F | Ht 67.0 in | Wt 224.0 lb

## 2018-09-10 DIAGNOSIS — Z87891 Personal history of nicotine dependence: Secondary | ICD-10-CM | POA: Insufficient documentation

## 2018-09-10 DIAGNOSIS — Z720 Tobacco use: Secondary | ICD-10-CM | POA: Diagnosis not present

## 2018-09-10 DIAGNOSIS — I10 Essential (primary) hypertension: Secondary | ICD-10-CM | POA: Diagnosis not present

## 2018-09-10 DIAGNOSIS — F17210 Nicotine dependence, cigarettes, uncomplicated: Secondary | ICD-10-CM | POA: Insufficient documentation

## 2018-09-10 DIAGNOSIS — F419 Anxiety disorder, unspecified: Secondary | ICD-10-CM | POA: Diagnosis not present

## 2018-09-10 HISTORY — DX: Personal history of nicotine dependence: Z87.891

## 2018-09-10 MED ORDER — SERTRALINE HCL 50 MG PO TABS: 50.0000 mg | ORAL_TABLET | Freq: Every day | ORAL | 3 refills | Status: DC

## 2018-09-10 NOTE — Assessment & Plan Note (Signed)
Encouraged cessation.  She will let us know when she is ready.

## 2018-09-10 NOTE — Assessment & Plan Note (Addendum)
Worsened.  We will transition her from Lexapro to Zoloft.  She will take half a tablet of Lexapro daily for 7 days then half a tablet of Lexapro every other day for 7 days.  She will then discontinue the Lexapro and transition to Zoloft.  Discussed that it may take 2 months for her to notice a big difference.  She will let us know how this is working in 1 month.

## 2018-09-10 NOTE — Assessment & Plan Note (Signed)
Well-controlled today.  Continue losartan.  Check BMP.

## 2018-09-10 NOTE — Progress Notes (Signed)
  Tommi Rumps, MD Phone: [phone]  Amy Day is a 43 y.o. female who presents today for follow-up.  CC: Hypertension, tobacco abuse, anxiety  Hypertension: Checked yesterday was 123/82.  Taking losartan.  No chest pain, shortness of breath, or edema.  Tobacco abuse: Patient quit for quite some time and was using electronic cigarettes.  She went back to smoking cigarettes and is smoking about half a pack per day.  She is not quite ready to quit.  She tried Chantix previously to help her quit.  Anxiety: Patient notes over the last month or so she is felt increasingly anxious.  She will feel like she has an adrenaline rush and feel on edge.  She will feel a flutter in her chest with this.  She is been taking Lexapro and thinks it is not working like it used to.  No depression.  She does not want to see a therapist at this time.   Social History   Tobacco Use  Smoking Status Current Every Day Smoker  . Types: E-cigarettes  . Last attempt to quit: 06/26/2004  . Years since quitting: 14.2  Smokeless Tobacco Never Used  Tobacco Comment   Vaping      ROS see history of present illness  Objective  Physical Exam Vitals:   09/10/18 1448  BP: 114/80  Pulse: 78  Temp: 97.7 F (36.5 C)  SpO2: 97%    BP Readings from Last 3 Encounters:  09/10/18 114/80  04/15/18 124/83  04/01/18 (!) 134/96   Wt Readings from Last 3 Encounters:  09/10/18 224 lb (101.6 kg)  04/15/18 223 lb 1.6 oz (101.2 kg)  04/01/18 223 lb 3.2 oz (101.2 kg)    Physical Exam Constitutional:      General: She is not in acute distress.    Appearance: She is not diaphoretic.  Cardiovascular:     Rate and Rhythm: Normal rate and regular rhythm.     Heart sounds: Normal heart sounds.  Pulmonary:     Effort: Pulmonary effort is normal.     Breath sounds: Normal breath sounds.  Skin:    General: Skin is warm and dry.  Neurological:     Mental Status: She is alert.      Assessment/Plan: Please  see individual problem list.  Benign essential HTN Well-controlled today.  Continue losartan.  Check BMP.  Tobacco abuse Encouraged cessation.  She will let us know when she is ready.  Anxiety Worsened.  We will transition her from Lexapro to Zoloft.  She will take half a tablet of Lexapro daily for 7 days then half a tablet of Lexapro every other day for 7 days.  She will then discontinue the Lexapro and transition to Zoloft.  Discussed that it may take 2 months for her to notice a big difference.  She will let us know how this is working in 1 month.   Health Maintenance: Patient received her flu vaccine through work.  She receives Pap smears through gynecology.  Orders Placed This Encounter  Procedures  . Basic Metabolic Panel (BMET)    Meds ordered this encounter  Medications  . sertraline (ZOLOFT) 50 MG tablet    Sig: Take 1 tablet (50 mg total) by mouth daily.    Dispense:  30 tablet    Refill:  3    Discontinue Lexapro prescription     Tommi Rumps, MD [place]

## 2018-09-10 NOTE — Patient Instructions (Signed)
Nice to see you. We will transition you to Zoloft.  Please start taking Lexapro half a tablet once daily for 7 days and then take half a tablet every other day for 7 days.  You can then discontinue Lexapro and start the Zoloft.  It may take 2 months for you to notice a big difference with this medication.

## 2018-09-11 LAB — BASIC METABOLIC PANEL
BUN: 11 mg/dL (ref 7–25)
CO2: 24 mmol/L (ref 20–32)
Calcium: 9.1 mg/dL (ref 8.6–10.2)
Chloride: 105 mmol/L (ref 98–110)
Creat: 0.9 mg/dL (ref 0.50–1.10)
Glucose, Bld: 123 mg/dL — ABNORMAL HIGH (ref 65–99)
Potassium: 3.9 mmol/L (ref 3.5–5.3)
Sodium: 139 mmol/L (ref 135–146)

## 2018-09-30 ENCOUNTER — Other Ambulatory Visit: Payer: Self-pay | Admitting: Family Medicine

## 2018-09-30 DIAGNOSIS — D751 Secondary polycythemia: Secondary | ICD-10-CM

## 2018-10-14 ENCOUNTER — Inpatient Hospital Stay: Payer: No Typology Code available for payment source | Attending: Oncology | Admitting: Oncology

## 2018-10-14 ENCOUNTER — Inpatient Hospital Stay: Payer: No Typology Code available for payment source

## 2018-10-14 ENCOUNTER — Other Ambulatory Visit: Payer: Self-pay

## 2018-10-14 VITALS — BP 144/84 | HR 83 | Temp 96.3°F | Resp 18 | Wt 219.9 lb

## 2018-10-14 DIAGNOSIS — Z720 Tobacco use: Secondary | ICD-10-CM | POA: Diagnosis not present

## 2018-10-14 DIAGNOSIS — D751 Secondary polycythemia: Secondary | ICD-10-CM

## 2018-10-14 LAB — CBC
HCT: 47.3 % — ABNORMAL HIGH (ref 36.0–46.0)
Hemoglobin: 15.7 g/dL — ABNORMAL HIGH (ref 12.0–15.0)
MCH: 32.2 pg (ref 26.0–34.0)
MCHC: 33.2 g/dL (ref 30.0–36.0)
MCV: 97.1 fL (ref 80.0–100.0)
Platelets: 281 10*3/uL (ref 150–400)
RBC: 4.87 MIL/uL (ref 3.87–5.11)
RDW: 11.9 % (ref 11.5–15.5)
WBC: 10.5 10*3/uL (ref 4.0–10.5)
nRBC: 0 % (ref 0.0–0.2)

## 2018-10-14 NOTE — Progress Notes (Signed)
Here for follow up. Overall " doing good " losing weight w diet-pleased w this .

## 2018-10-15 ENCOUNTER — Other Ambulatory Visit: Payer: Self-pay | Admitting: Family Medicine

## 2018-10-18 ENCOUNTER — Other Ambulatory Visit: Payer: Self-pay | Admitting: Family Medicine

## 2018-10-18 ENCOUNTER — Encounter: Payer: Self-pay | Admitting: Oncology

## 2018-10-18 NOTE — Progress Notes (Signed)
Hematology/Oncology Consult note [facility]  Telephone:([phone] Fax:([phone]  Patient Care Team: Leone Haven, MD as PCP - General (Family Medicine)   Name of the patient: Amy Day  [id number]  July 21, 1976   Date of visit: 10/18/18  Diagnosis-polycythemia likely secondary due to smoking  Chief complaint/ Reason for visit-routine follow-up of polycythemia  Heme/Onc history: patient is a 43 year old female who has been referred to [place] for polycythemia. She used to smoke in the past but quit smoking 30 years ago. However she did restart smoking about 2 weeks ago and currently smokes 3 cigarettes a day. Recent CBC done 9 days ago showed H&H of 16.4/48. White count platelet count was normal. Her hemoglobin has been 16.4 and 16.7 on 2 separate occasions. She denies any chronic lung disease. Denies any blood in her urine. Her appetite is good and she denies any unintentional weight loss. She reports getting a good night sleep and does not wake up multiple times in the night. Most of the time she does wake up feeling refreshed.  Results of blood work from 03/23/2018 were as follows: CBC showed white count of 9.4, H&H of 16.4/48 and a platelet count of 245.Jak 2 mutation testing was negative.  E Po level was normal at 4.1.  Urinalysis did not reveal any evidence of hematuria.  Chest x-ray showed no active cardiopulmonary disease.   Interval history-patient hopes to quit smoking soon and will be talking to her primary care doctor regarding smoking cessation therapy options.  ECOG PS- 1 Pain scale- 0   Review of systems- Review of Systems  Constitutional: Negative for chills, fever, malaise/fatigue and weight loss.  HENT: Negative for congestion, ear discharge and nosebleeds.   Eyes: Negative for blurred vision.  Respiratory: Negative for cough, hemoptysis, sputum production, shortness of breath and wheezing.   Cardiovascular: Negative  for chest pain, palpitations, orthopnea and claudication.  Gastrointestinal: Negative for abdominal pain, blood in stool, constipation, diarrhea, heartburn, melena, nausea and vomiting.  Genitourinary: Negative for dysuria, flank pain, frequency, hematuria and urgency.  Musculoskeletal: Negative for back pain, joint pain and myalgias.  Skin: Negative for rash.  Neurological: Negative for dizziness, tingling, focal weakness, seizures, weakness and headaches.  Endo/Heme/Allergies: Does not bruise/bleed easily.  Psychiatric/Behavioral: Negative for depression and suicidal ideas. The patient does not have insomnia.       No Known Allergies   Past Medical History:  Diagnosis Date  . Anxiety   . GERD (gastroesophageal reflux disease)   . Hypertension      Past Surgical History:  Procedure Laterality Date  . ABDOMINAL HYSTERECTOMY     RLQ pain; at [facility]  . ABLATION  2013  . BREAST REDUCTION SURGERY    . BREAST SURGERY  2012   Breast Reduction  . COLONOSCOPY WITH PROPOFOL N/A 06/25/2017   Procedure: COLONOSCOPY WITH PROPOFOL;  Surgeon: Lin Landsman, MD;  Location: [facility] ENDOSCOPY;  Service: Gastroenterology;  Laterality: N/A;  . REDUCTION MAMMAPLASTY     followed by breast lift  . TUBAL LIGATION  2006    Social History   Socioeconomic History  . Marital status: Married    Spouse name: Not on file  . Number of children: Not on file  . Years of education: Not on file  . Highest education level: Not on file  Occupational History  . Not on file  Social Needs  . Financial resource strain: Not on file  . Food insecurity:    Worry:  Not on file    Inability: Not on file  . Transportation needs:    Medical: Not on file    Non-medical: Not on file  Tobacco Use  . Smoking status: Current Every Day Smoker    Types: E-cigarettes    Last attempt to quit: 06/26/2004    Years since quitting: 14.3  . Smokeless tobacco: Never Used  . Tobacco comment: Vaping   Substance and  Sexual Activity  . Alcohol use: Yes    Alcohol/week: 0.0 standard drinks    Comment: Rare   . Drug use: No  . Sexual activity: Yes    Partners: Male    Birth control/protection: Surgical    Comment: Husband   Lifestyle  . Physical activity:    Days per week: Not on file    Minutes per session: Not on file  . Stress: Not on file  Relationships  . Social connections:    Talks on phone: Not on file    Gets together: Not on file    Attends religious service: Not on file    Active member of club or organization: Not on file    Attends meetings of clubs or organizations: Not on file    Relationship status: Not on file  . Intimate partner violence:    Fear of current or ex partner: Not on file    Emotionally abused: Not on file    Physically abused: Not on file    Forced sexual activity: Not on file  Other Topics Concern  . Not on file  Social History Narrative   Married    Employed at [employer]    Some college- highest level    Caffeine- Coffee, tea, and occasional soda     Family History  Problem Relation Age of Onset  . Diabetes Father   . Heart attack Father   . COPD Paternal Grandmother   . Breast cancer Paternal Grandmother 44     Current Outpatient Medications:  .  sertraline (ZOLOFT) 50 MG tablet, Take 1 tablet (50 mg total) by mouth daily., Disp: 30 tablet, Rfl: 3 .  fluticasone (FLONASE) 50 MCG/ACT nasal spray, Place 2 sprays into both nostrils daily. (Patient not taking: Reported on 10/14/2018), Disp: 16 g, Rfl: 6 .  loratadine (CLARITIN) 10 MG tablet, Take 1 tablet (10 mg total) by mouth daily. (Patient not taking: Reported on 10/14/2018), Disp: 30 tablet, Rfl: 11 .  losartan (COZAAR) 50 MG tablet, TAKE 1 TABLET BY MOUTH DAILY, Disp: 90 tablet, Rfl: 1 .  meloxicam (MOBIC) 15 MG tablet, Take 1 tablet (15 mg total) by mouth daily as needed for pain. (Patient not taking: Reported on 10/14/2018), Disp: 60 tablet, Rfl: 0 .  omeprazole (PRILOSEC) 40 MG  capsule, TAKE 1 CAPSULE BY MOUTH DAILY., Disp: 90 capsule, Rfl: 3  Physical exam:  Vitals:   10/14/18 1430  BP: (!) 144/84  Pulse: 83  Resp: 18  Temp: (!) 96.3 F (35.7 C)  TempSrc: Tympanic  Weight: 219 lb 14.4 oz (99.7 kg)   Physical Exam HENT:     Head: Normocephalic and atraumatic.  Eyes:     Pupils: Pupils are equal, round, and reactive to light.  Neck:     Musculoskeletal: Normal range of motion.  Cardiovascular:     Rate and Rhythm: Normal rate and regular rhythm.     Heart sounds: Normal heart sounds.  Pulmonary:     Effort: Pulmonary effort is normal.     Breath sounds:  Normal breath sounds.  Abdominal:     General: Bowel sounds are normal.     Palpations: Abdomen is soft.  Skin:    General: Skin is warm and dry.  Neurological:     Mental Status: She is alert and oriented to person, place, and time.      CMP Latest Ref Rng & Units 09/10/2018  Glucose 65 - 99 mg/dL 123(H)  BUN 7 - 25 mg/dL 11  Creatinine 0.50 - 1.10 mg/dL 0.90  Sodium 135 - 146 mmol/L 139  Potassium 3.5 - 5.3 mmol/L 3.9  Chloride 98 - 110 mmol/L 105  CO2 20 - 32 mmol/L 24  Calcium 8.6 - 10.2 mg/dL 9.1  Total Protein 6.0 - 8.3 g/dL -  Total Bilirubin 0.2 - 1.2 mg/dL -  Alkaline Phos 39 - 117 U/L -  AST 0 - 37 U/L -  ALT 0 - 35 U/L -   CBC Latest Ref Rng & Units 10/14/2018  WBC 4.0 - 10.5 K/uL 10.5  Hemoglobin 12.0 - 15.0 g/dL 15.7(H)  Hematocrit 36.0 - 46.0 % 47.3(H)  Platelets 150 - 400 K/uL 281      Assessment and plan- Patient is a 43 y.o. female with secondary polycythemia likely due to smoking  Patient's hematocrit is less than 50 and she does not require phlebotomy at this time.  Explained that her secondary polycythemia is likely due to smoking.  Hopefully if she manages to quit smoking altogether her polycythemia would improve even further.  I will see her back in 6 months time with a repeat CBC with differential for possible phlebotomy   Visit Diagnosis 1. Polycythemia,  secondary      Dr. Randa Evens, MD, MPH [facility] at [facility] [phone] 10/18/2018 10:44 AM

## 2018-11-05 ENCOUNTER — Ambulatory Visit
Admission: RE | Admit: 2018-11-05 | Discharge: 2018-11-05 | Disposition: A | Discharge status: Home / Self Care | Payer: No Typology Code available for payment source | Source: Ambulatory Visit | Attending: Obstetrics and Gynecology | Admitting: Obstetrics and Gynecology

## 2018-11-05 DIAGNOSIS — Z1231 Encounter for screening mammogram for malignant neoplasm of breast: Secondary | ICD-10-CM | POA: Insufficient documentation

## 2018-11-05 DIAGNOSIS — Z1239 Encounter for other screening for malignant neoplasm of breast: Secondary | ICD-10-CM

## 2018-11-08 ENCOUNTER — Other Ambulatory Visit: Payer: Self-pay | Admitting: Obstetrics and Gynecology

## 2018-11-08 DIAGNOSIS — R928 Other abnormal and inconclusive findings on diagnostic imaging of breast: Secondary | ICD-10-CM

## 2018-11-08 DIAGNOSIS — N6489 Other specified disorders of breast: Secondary | ICD-10-CM

## 2018-11-09 ENCOUNTER — Ambulatory Visit
Admission: RE | Admit: 2018-11-09 | Discharge: 2018-11-09 | Disposition: A | Discharge status: Home / Self Care | Payer: No Typology Code available for payment source | Source: Ambulatory Visit | Attending: Obstetrics and Gynecology | Admitting: Obstetrics and Gynecology

## 2018-11-09 ENCOUNTER — Encounter: Payer: Self-pay | Admitting: Obstetrics and Gynecology

## 2018-11-09 DIAGNOSIS — R928 Other abnormal and inconclusive findings on diagnostic imaging of breast: Secondary | ICD-10-CM

## 2018-11-09 DIAGNOSIS — N6489 Other specified disorders of breast: Secondary | ICD-10-CM | POA: Diagnosis present

## 2018-11-17 ENCOUNTER — Other Ambulatory Visit: Payer: Self-pay

## 2018-11-17 ENCOUNTER — Encounter: Payer: Self-pay | Admitting: Family

## 2018-11-17 ENCOUNTER — Ambulatory Visit (INDEPENDENT_AMBULATORY_CARE_PROVIDER_SITE_OTHER): Payer: No Typology Code available for payment source | Admitting: Family

## 2018-11-17 VITALS — BP 102/60 | HR 81 | Temp 98.4°F | Wt 215.8 lb

## 2018-11-17 DIAGNOSIS — F419 Anxiety disorder, unspecified: Secondary | ICD-10-CM | POA: Diagnosis not present

## 2018-11-17 DIAGNOSIS — R197 Diarrhea, unspecified: Secondary | ICD-10-CM | POA: Diagnosis not present

## 2018-11-17 DIAGNOSIS — R1010 Upper abdominal pain, unspecified: Secondary | ICD-10-CM | POA: Diagnosis not present

## 2018-11-17 LAB — CBC WITH DIFFERENTIAL/PLATELET
Basophils Absolute: 0.1 10*3/uL (ref 0.0–0.1)
Basophils Relative: 0.6 % (ref 0.0–3.0)
Eosinophils Absolute: 0.3 10*3/uL (ref 0.0–0.7)
Eosinophils Relative: 3.8 % (ref 0.0–5.0)
HCT: 47.7 % — ABNORMAL HIGH (ref 36.0–46.0)
Hemoglobin: 16.3 g/dL — ABNORMAL HIGH (ref 12.0–15.0)
Lymphocytes Relative: 18.4 % (ref 12.0–46.0)
Lymphs Abs: 1.7 10*3/uL (ref 0.7–4.0)
MCHC: 34.2 g/dL (ref 30.0–36.0)
MCV: 97.3 fl (ref 78.0–100.0)
Monocytes Absolute: 0.5 10*3/uL (ref 0.1–1.0)
Monocytes Relative: 6 % (ref 3.0–12.0)
Neutro Abs: 6.4 10*3/uL (ref 1.4–7.7)
Neutrophils Relative %: 71.2 % (ref 43.0–77.0)
Platelets: 290 10*3/uL (ref 150.0–400.0)
RBC: 4.91 Mil/uL (ref 3.87–5.11)
RDW: 13.2 % (ref 11.5–15.5)
WBC: 9 10*3/uL (ref 4.0–10.5)

## 2018-11-17 LAB — COMPREHENSIVE METABOLIC PANEL
ALT: 18 U/L (ref 0–35)
AST: 15 U/L (ref 0–37)
Albumin: 4.3 g/dL (ref 3.5–5.2)
Alkaline Phosphatase: 102 U/L (ref 39–117)
BUN: 14 mg/dL (ref 6–23)
CO2: 26 mEq/L (ref 19–32)
Calcium: 9.1 mg/dL (ref 8.4–10.5)
Chloride: 107 mEq/L (ref 96–112)
Creatinine, Ser: 0.81 mg/dL (ref 0.40–1.20)
GFR: 77.44 mL/min (ref >60.00)
Glucose, Bld: 104 mg/dL — ABNORMAL HIGH (ref 70–99)
Potassium: 4 mEq/L (ref 3.5–5.1)
Sodium: 139 mEq/L (ref 135–145)
Total Bilirubin: 0.4 mg/dL (ref 0.2–1.2)
Total Protein: 7.1 g/dL (ref 6.0–8.3)

## 2018-11-17 LAB — LIPASE: Lipase: 11 U/L (ref 11.0–59.0)

## 2018-11-17 MED ORDER — RANITIDINE HCL 150 MG PO TABS: 150.0000 mg | ORAL_TABLET | Freq: Every day | ORAL | 0 refills | Status: DC

## 2018-11-17 NOTE — Assessment & Plan Note (Addendum)
Etiology abdominal cramping, diarrhea explained nonspecific.  Patient is nontoxic in appearance.   normal abdominal exam today.  No focal tenderness.  No rebound. Differentials include gallbladder etiology, peptic ulcer disease, GERD.  We will start with labs today, as patient politely declines any imaging including right upper quadrant ultrasound or CT abdomen pelvis at this time.  She feels comfortable pursuing labs, stool cultures, and also H2B in addition to her Prilosec.  Note: CMA to call  pharmacy in regards to if Zantac is back on market after recall.

## 2018-11-17 NOTE — Patient Instructions (Addendum)
As discussed today, I suspect this could be gallbladder etiology, peptic ulcer disease.  Smoking can aggravate cardiac disease, as well as alcohol.  Strongly encourage you to limit these as much as possible  Avoid all NSAIDs (meloxicam, Aleve, ibuprofen)  Please return stool specimens.  Will be very much in touch.  Please let me know if any symptoms were to change or worsen as at that point we need to order imaging.

## 2018-11-17 NOTE — Progress Notes (Signed)
Subjective:    Patient ID: Amy Day, female    DOB: 16-May-1976, 43 y.o.   MRN: [id number]  CC: Amy Day is a 43 y.o. female who presents today for an acute visit.    HPI: [id number] brown diarhea  x 2 weeks. 3-4 times per day.  one month prior had interrmittent diarrhea. Eating more vegetables, tomatoes ; however when limited tomato intake,no difference in diarrhea 2 cups coffee which is typical for her.  No travel, sick contacts.  No recent antibiotics  As soon as eats, stomach will cramp, and then 10 miniutes later will have 'cramping' upper adbdominalpain. After has diarrhea, cramping will resolve. No vomiting, blood in stool, fever.  some nausea.  Takes NSAIDs , mobic prn.  GERD- on prilosec. No trouble sweallowing, epigastric burning, burping.   2 glasses of wine at night.   Smoker.   No h/o IBS  No dysuria.   Intentional weight loss.   Doing well on Zoloft.  Feels like palpitations have improved.  declines any changes to his medication at this time  H/o hysterectomy ; still has ovaries.  Colonoscopy UTD 2018 HISTORY:  Past Medical History:  Diagnosis Date  . Anxiety   . GERD (gastroesophageal reflux disease)   . Hypertension    Past Surgical History:  Procedure Laterality Date  . ABDOMINAL HYSTERECTOMY     RLQ pain; at [facility]  . ABLATION  2013  . BREAST REDUCTION SURGERY    . BREAST SURGERY  2012   Breast Reduction  . COLONOSCOPY WITH PROPOFOL N/A 06/25/2017   Procedure: COLONOSCOPY WITH PROPOFOL;  Surgeon: Lin Landsman, MD;  Location: [facility] ENDOSCOPY;  Service: Gastroenterology;  Laterality: N/A;  . REDUCTION MAMMAPLASTY     followed by breast lift  . TUBAL LIGATION  2006   Family History  Problem Relation Age of Onset  . Diabetes Father   . Heart attack Father   . COPD Paternal Grandmother   . Breast cancer Paternal Grandmother 55    Allergies: Patient has no known allergies. Current Outpatient Medications on File Prior to Visit   Medication Sig Dispense Refill  . losartan (COZAAR) 50 MG tablet TAKE 1 TABLET BY MOUTH DAILY 90 tablet 1  . omeprazole (PRILOSEC) 40 MG capsule TAKE 1 CAPSULE BY MOUTH DAILY. 90 capsule 3  . sertraline (ZOLOFT) 50 MG tablet Take 1 tablet (50 mg total) by mouth daily. 30 tablet 3  . fluticasone (FLONASE) 50 MCG/ACT nasal spray Place 2 sprays into both nostrils daily. (Patient not taking: Reported on 10/14/2018) 16 g 6  . loratadine (CLARITIN) 10 MG tablet Take 1 tablet (10 mg total) by mouth daily. (Patient not taking: Reported on 10/14/2018) 30 tablet 11  . meloxicam (MOBIC) 15 MG tablet Take 1 tablet (15 mg total) by mouth daily as needed for pain. (Patient not taking: Reported on 10/14/2018) 60 tablet 0   No current facility-administered medications on file prior to visit.     Social History   Tobacco Use  . Smoking status: Current Every Day Smoker    Types: E-cigarettes    Last attempt to quit: 06/26/2004    Years since quitting: 14.4  . Smokeless tobacco: Never Used  . Tobacco comment: Vaping   Substance Use Topics  . Alcohol use: Yes    Alcohol/week: 0.0 standard drinks    Comment: Rare   . Drug use: No    Review of Systems  Constitutional: Negative for chills and fever.  Respiratory: Negative  for cough.   Cardiovascular: Negative for chest pain and palpitations.  Gastrointestinal: Positive for abdominal pain, diarrhea and nausea. Negative for abdominal distention, anal bleeding, blood in stool and vomiting.  Genitourinary: Negative for dysuria and urgency.      Objective:    BP 102/60 (BP Location: Left Arm, Patient Position: Sitting, Cuff Size: Large)   Pulse 81   Temp 98.4 F (36.9 C)   Wt 215 lb 12.8 oz (97.9 kg)   SpO2 97%   BMI 33.80 kg/m   Wt Readings from Last 3 Encounters:  11/17/18 215 lb 12.8 oz (97.9 kg)  10/14/18 219 lb 14.4 oz (99.7 kg)  09/10/18 224 lb (101.6 kg)    Physical Exam Vitals signs reviewed.  Constitutional:      Appearance: Normal  appearance. She is well-developed.  Eyes:     Conjunctiva/sclera: Conjunctivae normal.  Cardiovascular:     Rate and Rhythm: Normal rate and regular rhythm.     Pulses: Normal pulses.     Heart sounds: Normal heart sounds.  Pulmonary:     Effort: Pulmonary effort is normal.     Breath sounds: Normal breath sounds. No wheezing, rhonchi or rales.  Abdominal:     General: Bowel sounds are normal. There is no distension.     Palpations: Abdomen is soft. Abdomen is not rigid. There is no fluid wave or mass.     Tenderness: There is no abdominal tenderness. There is no guarding or rebound.  Skin:    General: Skin is warm and dry.  Neurological:     Mental Status: She is alert.  Psychiatric:        Speech: Speech normal.        Behavior: Behavior normal.        Thought Content: Thought content normal.        Assessment & Plan:   Problem List Items Addressed This Visit      Other   Anxiety    Improved.  Patient will continue to follow with PCP.  She politely declines any changes to regimen at this time      Pain of upper abdomen - Primary   Relevant Medications   ranitidine (ZANTAC) 150 MG tablet   Other Relevant Orders   CBC with Differential/Platelet   Comprehensive metabolic panel   Lipase   Diarrhea    Etiology abdominal cramping, diarrhea explained nonspecific.  Patient is nontoxic in appearance.   normal abdominal exam today.  No focal tenderness.  No rebound. Differentials include gallbladder etiology, peptic ulcer disease, GERD.  We will start with labs today, as patient politely declines any imaging including right upper quadrant ultrasound or CT abdomen pelvis at this time.  She feels comfortable pursuing labs, stool cultures, and also H2B in addition to her Prilosec.  Note: CMA to call  pharmacy in regards to if Zantac is back on market after recall.      Relevant Medications   ranitidine (ZANTAC) 150 MG tablet   Other Relevant Orders   Fecal occult blood,  imunochemical   Gastrointestinal Pathogen Panel PCR   C Difficile Quick Screen w PCR reflex       I am having Amy Day start on ranitidine. I am also having her maintain her meloxicam, fluticasone, loratadine, sertraline, losartan, and omeprazole.   Meds ordered this encounter  Medications  . ranitidine (ZANTAC) 150 MG tablet    Sig: Take 1 tablet (150 mg total) by mouth at bedtime for 14 days.  Dispense:  14 tablet    Refill:  0    Order Specific Question:   Supervising Provider    Answer:   Crecencio Mc [[id number]]    Return precautions given.   Risks, benefits, and alternatives of the medications and treatment plan prescribed today were discussed, and patient expressed understanding.   Education regarding symptom management and diagnosis given to patient on AVS.  Continue to follow with Leone Haven, MD for routine health maintenance.   Amy Day and I agreed with plan.   Mable Paris, FNP

## 2018-11-17 NOTE — Assessment & Plan Note (Signed)
Improved.  Patient will continue to follow with PCP.  She politely declines any changes to regimen at this time

## 2018-11-18 ENCOUNTER — Other Ambulatory Visit: Payer: Self-pay | Admitting: Radiology

## 2018-11-18 ENCOUNTER — Other Ambulatory Visit (INDEPENDENT_AMBULATORY_CARE_PROVIDER_SITE_OTHER): Payer: No Typology Code available for payment source

## 2018-11-18 DIAGNOSIS — R197 Diarrhea, unspecified: Secondary | ICD-10-CM

## 2018-11-18 LAB — FECAL OCCULT BLOOD, IMMUNOCHEMICAL: Fecal Occult Bld: NEGATIVE

## 2018-11-18 NOTE — Addendum Note (Signed)
Addended by: Arby Barrette on: 11/18/2018 08:00 AM   Modules accepted: Orders

## 2018-11-19 LAB — GASTROINTESTINAL PATHOGEN PANEL PCR
C. difficile Tox A/B, PCR: NOT DETECTED
Campylobacter, PCR: NOT DETECTED
Cryptosporidium, PCR: NOT DETECTED
E coli (ETEC) LT/ST PCR: NOT DETECTED
E coli (STEC) stx1/stx2, PCR: NOT DETECTED
E coli 0157, PCR: NOT DETECTED
Giardia lamblia, PCR: NOT DETECTED
Norovirus, PCR: NOT DETECTED
Rotavirus A, PCR: NOT DETECTED
Salmonella, PCR: NOT DETECTED
Shigella, PCR: NOT DETECTED

## 2018-11-19 LAB — C. DIFFICILE GDH AND TOXIN A/B
GDH ANTIGEN: NOT DETECTED
MICRO NUMBER:: 337234
SPECIMEN QUALITY:: ADEQUATE
TOXIN A AND B: NOT DETECTED

## 2018-11-19 NOTE — Progress Notes (Signed)
I called pharmacy & zantac is back as of now for prescription only. It is slowly being titrated back into the market. I have notified patient that script was filled & ready.

## 2018-11-24 ENCOUNTER — Encounter: Payer: Self-pay | Admitting: Family

## 2018-11-24 ENCOUNTER — Other Ambulatory Visit: Payer: Self-pay | Admitting: Family

## 2018-11-24 DIAGNOSIS — R197 Diarrhea, unspecified: Secondary | ICD-10-CM

## 2018-11-30 ENCOUNTER — Telehealth: Payer: Self-pay | Admitting: Gastroenterology

## 2018-11-30 NOTE — Telephone Encounter (Signed)
LVM for Tele Visit for 2 weeks per [place].

## 2018-12-02 MED FILL — SERTRALINE HCL 50 MG TABLET: 50 | 30 days supply | Qty: 30 | Fill #0

## 2018-12-16 MED FILL — OMEPRAZOLE 40 MG CPDR: 40 | 90 days supply | Qty: 90 | Fill #0

## 2018-12-21 MED FILL — LOSARTAN POTASSIUM 50 MG TA: 50 | 30 days supply | Qty: 30 | Fill #0

## 2019-01-05 ENCOUNTER — Telehealth: Payer: Self-pay

## 2019-01-05 ENCOUNTER — Telehealth: Payer: Self-pay | Admitting: Family Medicine

## 2019-01-05 ENCOUNTER — Other Ambulatory Visit: Payer: No Typology Code available for payment source

## 2019-01-05 ENCOUNTER — Encounter (INDEPENDENT_AMBULATORY_CARE_PROVIDER_SITE_OTHER): Payer: No Typology Code available for payment source | Admitting: Family Medicine

## 2019-01-05 ENCOUNTER — Other Ambulatory Visit: Payer: Self-pay | Admitting: Family Medicine

## 2019-01-05 DIAGNOSIS — R300 Dysuria: Secondary | ICD-10-CM

## 2019-01-05 LAB — POCT URINALYSIS DIPSTICK
Glucose, UA: NEGATIVE
Ketones, UA: NEGATIVE
Nitrite, UA: POSITIVE
Protein, UA: POSITIVE — AB
Spec Grav, UA: 1.015 (ref 1.010–1.025)
Urobilinogen, UA: 2 E.U./dL — AB
pH, UA: 7 (ref 5.0–8.0)

## 2019-01-05 MED ORDER — SULFAMETHOXAZOLE-TRIMETHOPRIM 800-160 MG PO TABS: 1.0000 | ORAL_TABLET | Freq: Two times a day (BID) | ORAL | 0 refills | Status: AC

## 2019-01-05 NOTE — Telephone Encounter (Signed)
error 

## 2019-01-05 NOTE — Progress Notes (Signed)
Urine culture order -- future order  Ordered incorrectly in [employer]

## 2019-01-06 ENCOUNTER — Other Ambulatory Visit: Payer: No Typology Code available for payment source

## 2019-01-06 NOTE — Telephone Encounter (Signed)
Urine culture

## 2019-01-07 ENCOUNTER — Encounter: Payer: Self-pay | Admitting: Family Medicine

## 2019-01-07 LAB — URINE CULTURE
MICRO NUMBER:: 450065
SPECIMEN QUALITY:: ADEQUATE

## 2019-01-17 ENCOUNTER — Other Ambulatory Visit: Payer: Self-pay | Admitting: Family Medicine

## 2019-01-17 MED FILL — SERTRALINE HCL 50 MG TABS: 50 | 30 days supply | Qty: 30 | Fill #0

## 2019-01-31 IMAGING — MG MM DIGITAL SCREENING BILAT W/ CAD
5 series · 5 of 5 positions shown · non-contrast
Comparison: Previous exam(s).

CLINICAL DATA: Screening. The patient has had an interval breast
reduction and lift.

EXAM:
DIGITAL SCREENING BILATERAL MAMMOGRAM WITH CAD

[L CC]
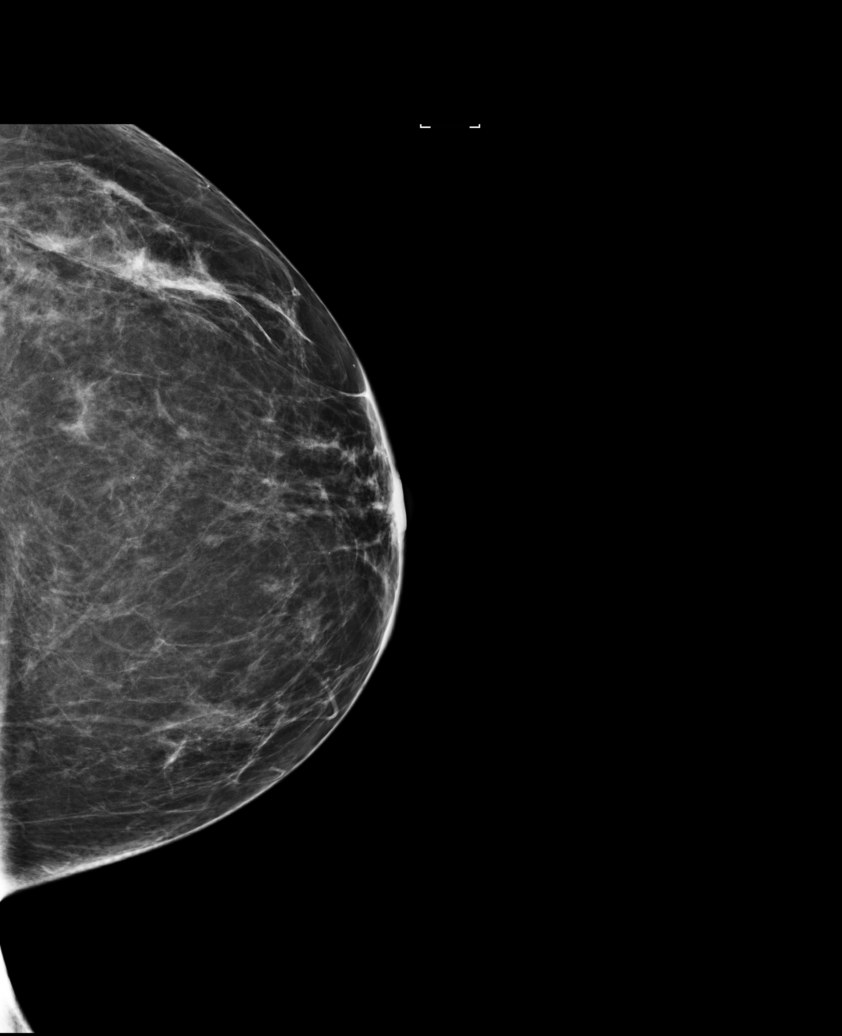

[R MLO]
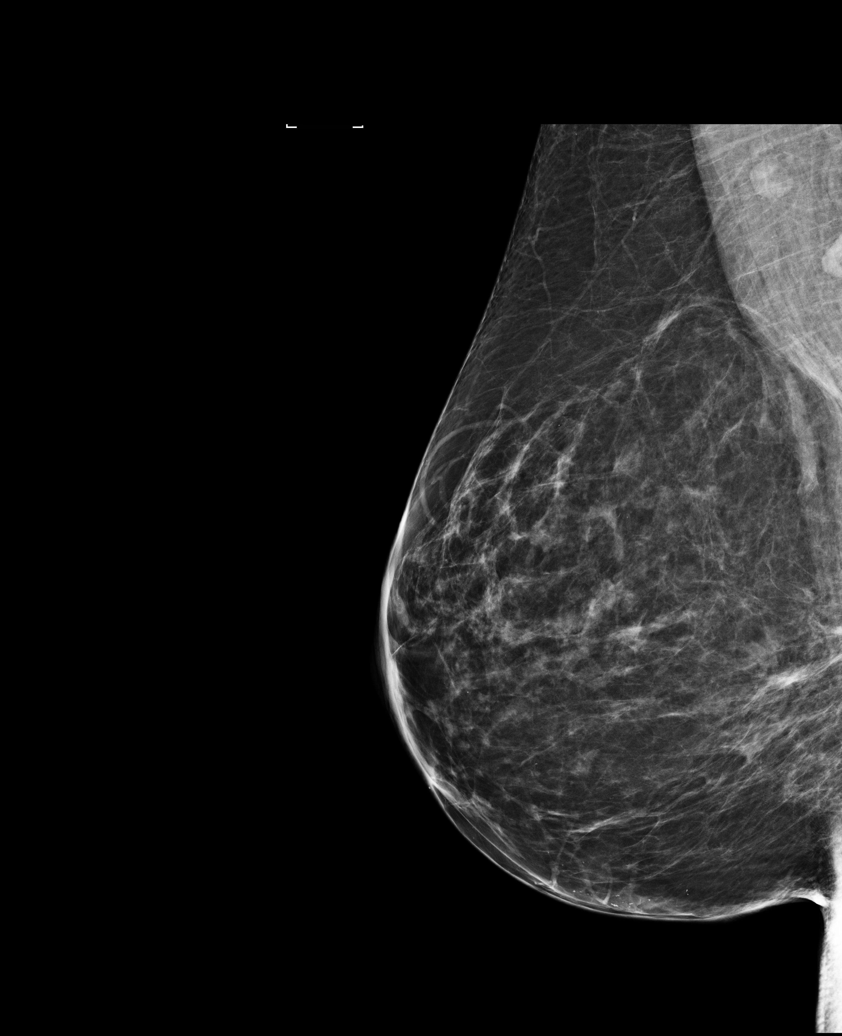

[L MLO]
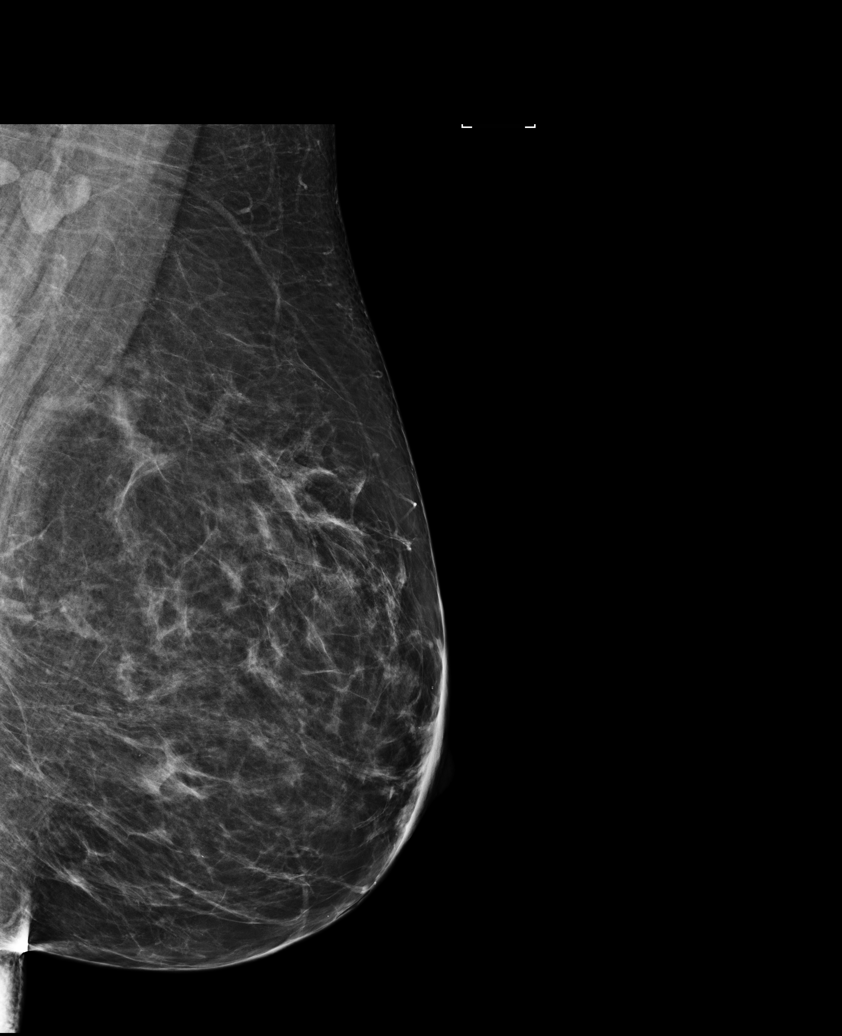

[L XCCL]
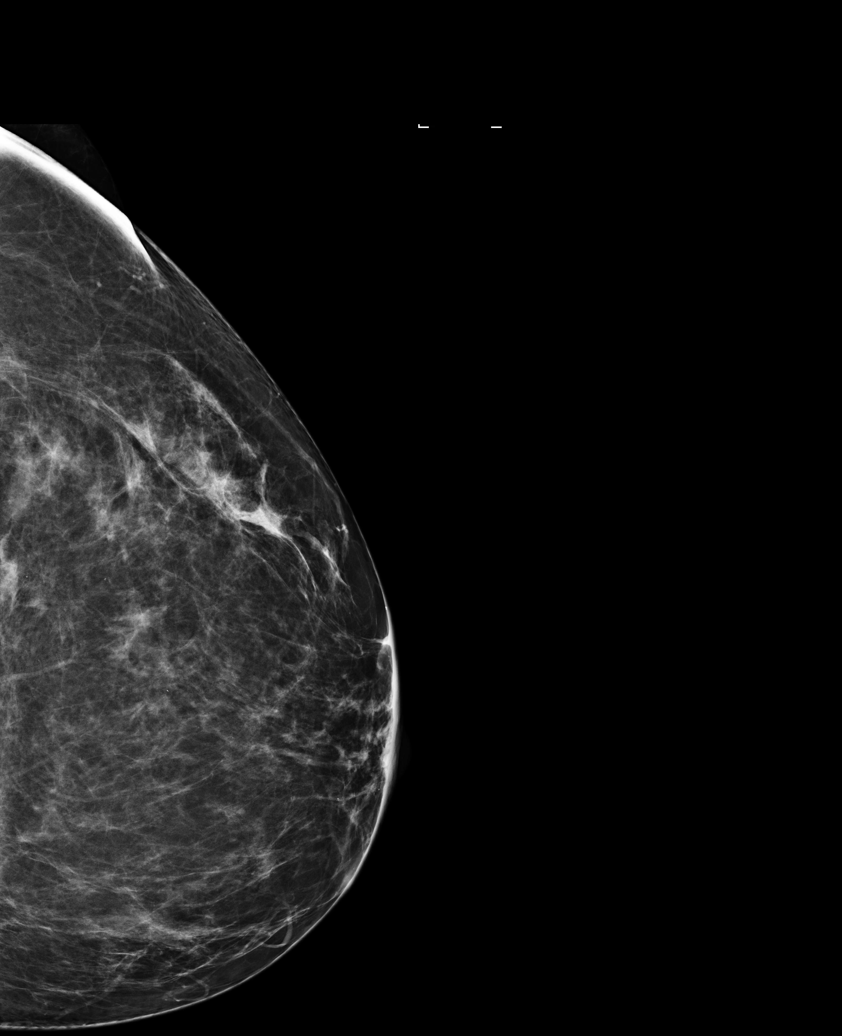

[R CC]
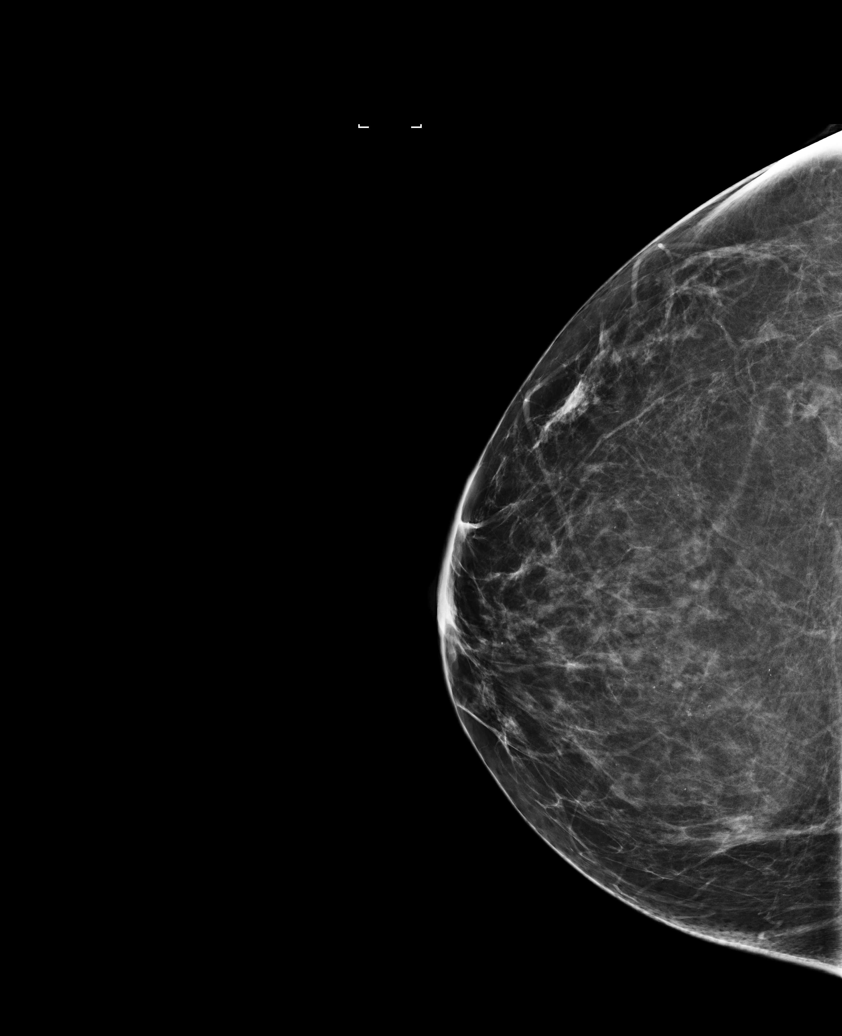

[5 of 5 positions shown; findings below may reference images not displayed]

ACR Breast Density Category b: There are scattered areas of
fibroglandular density.
FINDINGS: There are changes of interval breast reduction and lift. Today's
exam will serve as a new baseline. There are no findings suspicious
for malignancy. Images were processed with CAD.
IMPRESSION: No mammographic evidence of malignancy. A result letter of this
screening mammogram will be mailed directly to the patient.

RECOMMENDATION:
Screening mammogram in one year. (Code:BD-4-KU7)

BI-RADS CATEGORY  2: Benign.

## 2019-02-12 ENCOUNTER — Other Ambulatory Visit: Payer: Self-pay | Admitting: Family Medicine

## 2019-02-12 MED FILL — LOSARTAN POTASSIUM 50 MG TA: 50 | 30 days supply | Qty: 30 | Fill #1

## 2019-02-14 NOTE — Progress Notes (Signed)
Chief Complaint  Patient presents with  . Gynecologic Exam     HPI:      [name] is a 43 y.o. [id number] who LMP was No LMP recorded. Patient has had a hysterectomy., presents today for her annual examination. Her menses are absent due to lap hyst/bilat salpingectomy for RLQ pain 2/19. Dysmenorrhea none. She does not have intermenstrual bleeding.   Sex activity: single partner, contraception - tubal ligation/hyst. Has a little vaginal dryness, improved with lubricants. Last Pap: June 05, 2015  Results were: no abnormalities /neg HPV DNA   Last mammogram: 11/09/18; normal results, repeat in 12 months There is a FH of breast cancer in her PGM, genetic testing not indicated. There is no FH of ovarian cancer. The patient does do self-breast exams.  Tobacco use: Smoking 1/2 ppd. Trying to quit. Alcohol use: nightly 2 glasses  Exercise: mod active  She does get adequate calcium but not Vitamin D in her diet.  She has labs with her PCP.   Past Medical History:  Diagnosis Date  . Anxiety   . GERD (gastroesophageal reflux disease)   . Hypertension     Past Surgical History:  Procedure Laterality Date  . ABDOMINAL HYSTERECTOMY     RLQ pain; at [facility]  . ABLATION  2013  . BREAST REDUCTION SURGERY    . BREAST SURGERY  2012   Breast Reduction  . COLONOSCOPY WITH PROPOFOL N/A 06/25/2017   Procedure: COLONOSCOPY WITH PROPOFOL;  Surgeon: Amy Landsman, MD;  Location: [facility] ENDOSCOPY;  Service: Gastroenterology;  Laterality: N/A;  . REDUCTION MAMMAPLASTY     followed by breast lift  . TUBAL LIGATION  2006    Family History  Problem Relation Age of Onset  . Diabetes Father   . Heart attack Father   . COPD Paternal Grandmother   . Breast cancer Paternal Grandmother 76    Social History   Socioeconomic History  . Marital status: Married    Spouse name: Not on file  . Number of children: Not on file  . Years of education: Not on file  . Highest education  level: Not on file  Occupational History  . Not on file  Social Needs  . Financial resource strain: Not on file  . Food insecurity    Worry: Not on file    Inability: Not on file  . Transportation needs    Medical: Not on file    Non-medical: Not on file  Tobacco Use  . Smoking status: Current Every Day Smoker    Types: E-cigarettes    Last attempt to quit: 06/26/2004    Years since quitting: 14.6  . Smokeless tobacco: Never Used  . Tobacco comment: Vaping   Substance and Sexual Activity  . Alcohol use: Yes    Alcohol/week: 0.0 standard drinks    Comment: Rare   . Drug use: No  . Sexual activity: Yes    Partners: Male    Birth control/protection: Surgical    Comment: Husband   Lifestyle  . Physical activity    Days per week: Not on file    Minutes per session: Not on file  . Stress: Not on file  Relationships  . Social Herbalist on phone: Not on file    Gets together: Not on file    Attends religious service: Not on file    Active member of club or organization: Not on file    Attends meetings of  clubs or organizations: Not on file    Relationship status: Not on file  . Intimate partner violence    Fear of current or ex partner: Not on file    Emotionally abused: Not on file    Physically abused: Not on file    Forced sexual activity: Not on file  Other Topics Concern  . Not on file  Social History Narrative   Married    Employed at [employer]    Some college- highest level    Caffeine- Coffee, tea, and occasional soda     Current Outpatient Medications on File Prior to Visit  Medication Sig Dispense Refill  . fluticasone (FLONASE) 50 MCG/ACT nasal spray Place 2 sprays into both nostrils daily. 16 g 6  . loratadine (CLARITIN) 10 MG tablet Take 1 tablet (10 mg total) by mouth daily. 30 tablet 11  . losartan (COZAAR) 50 MG tablet TAKE 1 TABLET BY MOUTH DAILY 90 tablet 1  . meloxicam (MOBIC) 15 MG tablet Take 1 tablet (15 mg total) by  mouth daily as needed for pain. 60 tablet 0  . omeprazole (PRILOSEC) 40 MG capsule TAKE 1 CAPSULE BY MOUTH DAILY. 90 capsule 3  . sertraline (ZOLOFT) 50 MG tablet TAKE 1 TABLET (50 MG TOTAL) BY MOUTH DAILY. **STOP LEXAPRO** 30 tablet 0  . ranitidine (ZANTAC) 150 MG tablet Take 1 tablet (150 mg total) by mouth at bedtime for 14 days. 14 tablet 0   No current facility-administered medications on file prior to visit.       ROS:  Review of Systems  Constitutional: Negative for fatigue, fever and unexpected weight change.  Respiratory: Negative for cough, shortness of breath and wheezing.   Cardiovascular: Negative for chest pain, palpitations and leg swelling.  Gastrointestinal: Negative for blood in stool, constipation, diarrhea, nausea and vomiting.  Endocrine: Negative for cold intolerance, heat intolerance and polyuria.  Genitourinary: Negative for dyspareunia, dysuria, flank pain, frequency, genital sores, hematuria, menstrual problem, pelvic pain, urgency, vaginal bleeding, vaginal discharge and vaginal pain.  Musculoskeletal: Negative for back pain, joint swelling and myalgias.  Skin: Negative for rash.  Neurological: Negative for dizziness, syncope, light-headedness, numbness and headaches.  Hematological: Negative for adenopathy.  Psychiatric/Behavioral: Negative for agitation, confusion, sleep disturbance and suicidal ideas. The patient is not nervous/anxious.      Objective: BP 128/74   Ht 5\' 7"  (1.702 m)   Wt 211 lb (95.7 kg)   BMI 33.05 kg/m    Physical Exam Constitutional:      Appearance: She is well-developed.  Genitourinary:     Vulva, vagina, right adnexa and left adnexa normal.     No vaginal discharge, erythema or tenderness.     Cervix is absent.     Uterus is absent.     No right or left adnexal mass present.     Right adnexa not tender.     Left adnexa not tender.     Genitourinary Comments: UTERUS/CX SURG REM  Neck:     Musculoskeletal: Normal range  of motion.     Thyroid: No thyromegaly.  Cardiovascular:     Rate and Rhythm: Normal rate and regular rhythm.     Heart sounds: Normal heart sounds. No murmur.  Pulmonary:     Effort: Pulmonary effort is normal.     Breath sounds: Normal breath sounds.  Chest:     Breasts:        Right: No mass, nipple discharge, skin change or tenderness.  Left: No mass, nipple discharge, skin change or tenderness.  Abdominal:     Palpations: Abdomen is soft.     Tenderness: There is no abdominal tenderness. There is no guarding.  Musculoskeletal: Normal range of motion.  Neurological:     General: No focal deficit present.     Mental Status: She is alert and oriented to person, place, and time.     Cranial Nerves: No cranial nerve deficit.  Skin:    General: Skin is warm and dry.  Psychiatric:        Mood and Affect: Mood normal.        Behavior: Behavior normal.        Thought Content: Thought content normal.        Judgment: Judgment normal.  Vitals signs reviewed.     Assessment/Plan: Encounter for annual routine gynecological examination   Screening for breast cancer - Plan: MM 3D SCREEN BREAST BILATERAL, Current on mammo. Order placed for next yr.       GYN counsel breast self exam, mammography screening, adequate intake of calcium and vitamin D, diet and exercise     F/U  Return in about 1 year (around 02/15/2020).  Alicia B. Copland, PA-C 02/15/2019 9:01 AM

## 2019-02-15 ENCOUNTER — Encounter: Payer: Self-pay | Admitting: Obstetrics and Gynecology

## 2019-02-15 ENCOUNTER — Other Ambulatory Visit: Payer: Self-pay

## 2019-02-15 ENCOUNTER — Ambulatory Visit (INDEPENDENT_AMBULATORY_CARE_PROVIDER_SITE_OTHER): Payer: No Typology Code available for payment source | Admitting: Obstetrics and Gynecology

## 2019-02-15 ENCOUNTER — Ambulatory Visit: Payer: 59 | Admitting: Obstetrics and Gynecology

## 2019-02-15 VITALS — BP 128/74 | Ht 67.0 in | Wt 211.0 lb

## 2019-02-15 DIAGNOSIS — Z01419 Encounter for gynecological examination (general) (routine) without abnormal findings: Secondary | ICD-10-CM | POA: Diagnosis not present

## 2019-02-15 DIAGNOSIS — Z1239 Encounter for other screening for malignant neoplasm of breast: Secondary | ICD-10-CM

## 2019-02-15 NOTE — Patient Instructions (Signed)
I value your feedback and entrusting us with your care. If you get a [facility] patient survey, I would appreciate you taking the time to let us know about your experience today. Thank you! 

## 2019-02-16 NOTE — Telephone Encounter (Signed)
Pt requesting a refill on zoloft to be sent to [facility].   Nina,cma

## 2019-02-17 MED FILL — SERTRALINE HCL 50 MG TABLET: 50 | 90 days supply | Qty: 90 | Fill #0

## 2019-03-16 MED FILL — LOSARTAN POTASSIUM 50 MG TA: 50 | 30 days supply | Qty: 30 | Fill #2

## 2019-04-08 ENCOUNTER — Other Ambulatory Visit: Payer: Self-pay | Admitting: Family Medicine

## 2019-04-11 MED FILL — LOSARTAN POTASSIUM 50 MG TA: 50 | 30 days supply | Qty: 30 | Fill #0

## 2019-04-22 MED FILL — OMEPRAZOLE 40 MG CPDR: 40 | 90 days supply | Qty: 90 | Fill #1

## 2019-05-12 MED FILL — SERTRALINE HCL 50 MG TABLET: 50 | 90 days supply | Qty: 90 | Fill #1

## 2019-05-12 MED FILL — LOSARTAN POTASSIUM 50 MG TA: 50 | 30 days supply | Qty: 30 | Fill #1

## 2019-06-18 MED FILL — LOSARTAN POTASSIUM 50 MG TA: 50 | 30 days supply | Qty: 30 | Fill #2

## 2019-07-18 MED FILL — LOSARTAN POTASSIUM 50 MG TA: 50 | 30 days supply | Qty: 30 | Fill #3

## 2019-07-20 MED FILL — OMEPRAZOLE 40 MG CPDR: 40 | 90 days supply | Qty: 90 | Fill #2

## 2019-08-11 ENCOUNTER — Other Ambulatory Visit: Payer: Self-pay | Admitting: Family Medicine

## 2019-08-11 MED FILL — SERTRALINE HCL 50 MG TABLET: 50 | 90 days supply | Qty: 90 | Fill #0

## 2019-08-17 MED FILL — LOSARTAN POTASSIUM 50 MG TA: 50 | 30 days supply | Qty: 30 | Fill #4

## 2019-09-15 MED FILL — LOSARTAN POTASSIUM 50 MG TA: 50 | 30 days supply | Qty: 30 | Fill #5

## 2019-10-03 ENCOUNTER — Ambulatory Visit (INDEPENDENT_AMBULATORY_CARE_PROVIDER_SITE_OTHER): Payer: No Typology Code available for payment source | Admitting: Family Medicine

## 2019-10-03 ENCOUNTER — Other Ambulatory Visit: Payer: Self-pay

## 2019-10-03 ENCOUNTER — Ambulatory Visit (INDEPENDENT_AMBULATORY_CARE_PROVIDER_SITE_OTHER): Payer: No Typology Code available for payment source

## 2019-10-03 ENCOUNTER — Encounter: Payer: Self-pay | Admitting: Family Medicine

## 2019-10-03 VITALS — BP 120/80 | HR 90 | Ht 67.0 in | Wt 210.0 lb

## 2019-10-03 DIAGNOSIS — M5412 Radiculopathy, cervical region: Secondary | ICD-10-CM | POA: Diagnosis not present

## 2019-10-03 DIAGNOSIS — I10 Essential (primary) hypertension: Secondary | ICD-10-CM

## 2019-10-03 HISTORY — DX: Radiculopathy, cervical region: M54.12

## 2019-10-03 MED ORDER — LOSARTAN POTASSIUM 50 MG PO TABS: 50.0000 mg | ORAL_TABLET | Freq: Every day | ORAL | 1 refills | Status: DC

## 2019-10-03 MED ORDER — PREDNISONE 20 MG PO TABS: 40.0000 mg | ORAL_TABLET | Freq: Every day | ORAL | 0 refills | Status: DC

## 2019-10-03 MED ORDER — CYCLOBENZAPRINE HCL 10 MG PO TABS: 10.0000 mg | ORAL_TABLET | Freq: Three times a day (TID) | ORAL | 0 refills | Status: DC | PRN

## 2019-10-03 MED ORDER — OMEPRAZOLE 40 MG PO CPDR: 40.0000 mg | DELAYED_RELEASE_CAPSULE | Freq: Every day | ORAL | 3 refills | Status: DC

## 2019-10-03 NOTE — Assessment & Plan Note (Signed)
Well-controlled.  Losartan refilled.  Check lab work.

## 2019-10-03 NOTE — Progress Notes (Signed)
Tommi Rumps, MD Phone: [phone]  Amy Day is a 44 y.o. female who presents today for same day visit.  Neck pain: Patient notes this has been going on intermittently for a few months.  Last night she noted it in her right posterior neck radiating down into her right arm particularly into her volar forearm.  She notes no numbness though has had some tingling.  She felt subjective weakness in her right arm this morning.  No incontinence.  No injury.  Hypertension: She is taking losartan.  She is due for lab work.  Social History   Tobacco Use  Smoking Status Current Every Day Smoker  . Types: E-cigarettes  . Last attempt to quit: 06/26/2004  . Years since quitting: 15.2  Smokeless Tobacco Never Used  Tobacco Comment   Vaping      ROS see history of present illness  Objective  Physical Exam Vitals:   10/03/19 1452  BP: 120/80  Pulse: 90  SpO2: 97%    BP Readings from Last 3 Encounters:  10/03/19 120/80  02/15/19 128/74  11/17/18 102/60   Wt Readings from Last 3 Encounters:  10/03/19 210 lb (95.3 kg)  02/15/19 211 lb (95.7 kg)  11/17/18 215 lb 12.8 oz (97.9 kg)    Physical Exam Constitutional:      General: She is not in acute distress.    Appearance: She is not diaphoretic.  Cardiovascular:     Rate and Rhythm: Normal rate and regular rhythm.     Heart sounds: Normal heart sounds.  Pulmonary:     Effort: Pulmonary effort is normal.     Breath sounds: Normal breath sounds.  Musculoskeletal:     Comments: No midline cervical or upper thoracic spine tenderness, no midline cervical or upper thoracic spine step-off, no muscular neck or upper thoracic back tenderness  Skin:    General: Skin is warm and dry.  Neurological:     Mental Status: She is alert.     Comments: 5/5 strength in bilateral biceps, triceps, grip, quads, hamstrings, plantar and dorsiflexion, sensation to light touch intact in bilateral UE and LE, normal gait, 2+ patellar, biceps,  and brachioradialis reflexes      Assessment/Plan: Please see individual problem list.  Benign essential HTN Well-controlled.  Losartan refilled.  Check lab work.  Cervical radiculopathy Symptoms are consistent with nerve impingement.  Discussed cervical spine x-ray today.  Will place on prednisone and Flexeril.  Discussed risk of drowsiness with Flexeril.  Advised to take this at night and to not drive if she is drowsy.  Advised no alcohol intake with the Flexeril.  Discussed reasons to seek medical attention in the emergency room.   Orders Placed This Encounter  Procedures  . DG Cervical Spine Complete    Standing Status:   Future    Standing Expiration Date:   11/30/2020    Order Specific Question:   Reason for Exam (SYMPTOM  OR DIAGNOSIS REQUIRED)    Answer:   right sided neck pain, radiating to right volar forearm with tingling    Order Specific Question:   Is patient pregnant?    Answer:   No    Order Specific Question:   Preferred imaging location?    Answer:   [facility] Specific Question:   Radiology Contrast Protocol - do NOT remove file path    Answer:   [email or web address]  . Comp Met (CMET)  . Lipid panel    Meds ordered  this encounter  Medications  . losartan (COZAAR) 50 MG tablet    Sig: Take 1 tablet (50 mg total) by mouth daily.    Dispense:  90 tablet    Refill:  1  . omeprazole (PRILOSEC) 40 MG capsule    Sig: Take 1 capsule (40 mg total) by mouth daily.    Dispense:  90 capsule    Refill:  3  . predniSONE (DELTASONE) 20 MG tablet    Sig: Take 2 tablets (40 mg total) by mouth daily with breakfast.    Dispense:  10 tablet    Refill:  0  . cyclobenzaprine (FLEXERIL) 10 MG tablet    Sig: Take 1 tablet (10 mg total) by mouth 3 (three) times daily as needed for muscle spasms.    Dispense:  30 tablet    Refill:  0    This visit occurred during the SARS-CoV-2 public health emergency.  Safety  protocols were in place, including screening questions prior to the visit, additional usage of staff PPE, and extensive cleaning of exam room while observing appropriate contact time as indicated for disinfecting solutions.    Tommi Rumps, MD [place]

## 2019-10-03 NOTE — Assessment & Plan Note (Signed)
Symptoms are consistent with nerve impingement.  Discussed cervical spine x-ray today.  Will place on prednisone and Flexeril.  Discussed risk of drowsiness with Flexeril.  Advised to take this at night and to not drive if she is drowsy.  Advised no alcohol intake with the Flexeril.  Discussed reasons to seek medical attention in the emergency room.

## 2019-10-04 LAB — COMPREHENSIVE METABOLIC PANEL
ALT: 12 U/L (ref 0–35)
AST: 10 U/L (ref 0–37)
Albumin: 4.1 g/dL (ref 3.5–5.2)
Alkaline Phosphatase: 95 U/L (ref 39–117)
BUN: 14 mg/dL (ref 6–23)
CO2: 25 mEq/L (ref 19–32)
Calcium: 8.8 mg/dL (ref 8.4–10.5)
Chloride: 106 mEq/L (ref 96–112)
Creatinine, Ser: 0.9 mg/dL (ref 0.40–1.20)
GFR: 68.29 mL/min (ref >60.00)
Glucose, Bld: 85 mg/dL (ref 70–99)
Potassium: 4 mEq/L (ref 3.5–5.1)
Sodium: 138 mEq/L (ref 135–145)
Total Bilirubin: 0.3 mg/dL (ref 0.2–1.2)
Total Protein: 6.5 g/dL (ref 6.0–8.3)

## 2019-10-04 LAB — LIPID PANEL
Cholesterol: 187 mg/dL (ref 0–200)
HDL: 57.2 mg/dL (ref >39.00)
LDL Cholesterol: 95 mg/dL (ref 0–99)
NonHDL: 129.73
Total CHOL/HDL Ratio: 3
Triglycerides: 174 mg/dL — ABNORMAL HIGH (ref 0.0–149.0)
VLDL: 34.8 mg/dL (ref 0.0–40.0)

## 2019-10-17 ENCOUNTER — Encounter: Payer: Self-pay | Admitting: Family Medicine

## 2019-10-18 MED ORDER — CHANTIX STARTING MONTH PAK 0.5 MG X 11 & 1 MG X 42 PO TABS: ORAL_TABLET | ORAL | 0 refills | Status: DC

## 2019-11-10 MED FILL — SERTRALINE HCL 50 MG TABLET: 50 | 90 days supply | Qty: 90 | Fill #1

## 2019-11-15 ENCOUNTER — Encounter: Payer: Self-pay | Admitting: Family Medicine

## 2019-11-16 MED ORDER — LOSARTAN POTASSIUM 50 MG PO TABS: 50.0000 mg | ORAL_TABLET | Freq: Every day | ORAL | 1 refills | Status: DC

## 2019-11-16 MED ORDER — SERTRALINE HCL 50 MG PO TABS: 50.0000 mg | ORAL_TABLET | Freq: Every day | ORAL | 1 refills | Status: DC

## 2019-11-16 MED ORDER — OMEPRAZOLE 40 MG PO CPDR: 40.0000 mg | DELAYED_RELEASE_CAPSULE | Freq: Every day | ORAL | 3 refills | Status: DC

## 2019-11-18 ENCOUNTER — Telehealth: Payer: Self-pay

## 2019-11-18 NOTE — Telephone Encounter (Signed)
Please let the patient know and see if she wants to purchase this OTC or try something else.

## 2019-11-18 NOTE — Telephone Encounter (Signed)
Amy Day stated she will try OVC and if it does not work she will call back for something else.  Geni Skorupski,ca

## 2020-01-04 ENCOUNTER — Encounter: Payer: Self-pay | Admitting: Family Medicine

## 2020-01-05 MED ORDER — OMEPRAZOLE 40 MG PO CPDR: 40.0000 mg | DELAYED_RELEASE_CAPSULE | Freq: Every day | ORAL | 3 refills | Status: DC

## 2020-01-06 MED ORDER — PANTOPRAZOLE SODIUM 40 MG PO TBEC: 40.0000 mg | DELAYED_RELEASE_TABLET | Freq: Every day | ORAL | 3 refills | Status: DC

## 2020-01-10 ENCOUNTER — Telehealth: Payer: Self-pay

## 2020-01-10 NOTE — Telephone Encounter (Signed)
PA started today on covermymeds for Pantoprazole Sodium 40 mg.  Lezlie Ritchey,cma

## 2020-01-11 ENCOUNTER — Telehealth: Payer: Self-pay

## 2020-01-11 NOTE — Telephone Encounter (Signed)
mychart message sent to patient

## 2020-02-02 ENCOUNTER — Other Ambulatory Visit: Payer: Self-pay

## 2020-02-02 DIAGNOSIS — K219 Gastro-esophageal reflux disease without esophagitis: Secondary | ICD-10-CM

## 2020-02-02 MED ORDER — OMEPRAZOLE 40 MG PO CPDR: 40.0000 mg | DELAYED_RELEASE_CAPSULE | Freq: Every day | ORAL | 1 refills | Status: DC

## 2020-02-02 NOTE — Progress Notes (Signed)
Patient came by the office and stated that per her mom who is on omeprazole if she did a 90 day supply at [facility]  and used the good rx card she can get this medication without insurance for $14.  I reordered the medication and  sent it to [facility].  Broden Holt,cma

## 2020-06-16 ENCOUNTER — Encounter: Payer: Self-pay | Admitting: Family Medicine

## 2020-06-18 MED ORDER — LOSARTAN POTASSIUM 50 MG PO TABS: 50.0000 mg | ORAL_TABLET | Freq: Every day | ORAL | 1 refills | Status: DC

## 2020-07-09 ENCOUNTER — Ambulatory Visit: Payer: Commercial Managed Care - PPO | Admitting: Family Medicine

## 2020-07-09 ENCOUNTER — Encounter: Payer: Self-pay | Admitting: Family Medicine

## 2020-07-09 ENCOUNTER — Other Ambulatory Visit: Payer: Self-pay

## 2020-07-09 VITALS — BP 130/70 | HR 95 | Temp 98.4°F | Ht 67.0 in | Wt 217.6 lb

## 2020-07-09 DIAGNOSIS — F17210 Nicotine dependence, cigarettes, uncomplicated: Secondary | ICD-10-CM

## 2020-07-09 DIAGNOSIS — Z800 Family history of malignant neoplasm of digestive organs: Secondary | ICD-10-CM | POA: Insufficient documentation

## 2020-07-09 DIAGNOSIS — I10 Essential (primary) hypertension: Secondary | ICD-10-CM

## 2020-07-09 DIAGNOSIS — Z23 Encounter for immunization: Secondary | ICD-10-CM

## 2020-07-09 DIAGNOSIS — K219 Gastro-esophageal reflux disease without esophagitis: Secondary | ICD-10-CM

## 2020-07-09 DIAGNOSIS — E6609 Other obesity due to excess calories: Secondary | ICD-10-CM

## 2020-07-09 DIAGNOSIS — Z6834 Body mass index (BMI) 34.0-34.9, adult: Secondary | ICD-10-CM

## 2020-07-09 DIAGNOSIS — Z1231 Encounter for screening mammogram for malignant neoplasm of breast: Secondary | ICD-10-CM

## 2020-07-09 HISTORY — DX: Family history of malignant neoplasm of digestive organs: Z80.0

## 2020-07-09 LAB — BASIC METABOLIC PANEL
BUN: 13 mg/dL (ref 6–23)
CO2: 24 mEq/L (ref 19–32)
Calcium: 9.4 mg/dL (ref 8.4–10.5)
Chloride: 106 mEq/L (ref 96–112)
Creatinine, Ser: 0.9 mg/dL (ref 0.40–1.20)
GFR: 78.07 mL/min (ref >60.00)
Glucose, Bld: 99 mg/dL (ref 70–99)
Potassium: 4.2 mEq/L (ref 3.5–5.1)
Sodium: 137 mEq/L (ref 135–145)

## 2020-07-09 LAB — HEMOGLOBIN A1C: Hgb A1c MFr Bld: 5.4 % (ref 4.6–6.5)

## 2020-07-09 NOTE — Assessment & Plan Note (Signed)
Smoking cessation counseling was provided.  Approximately 3 minutes were spent discussing the rationale for tobacco cessation and strategies for doing so.  Adjuncts, including varenicline and buproprion were recommended.  Discussed continuing with her plan to quit smoking.  She will let us know if she would like medication management for this.  Follow-up in 6 months.

## 2020-07-09 NOTE — Assessment & Plan Note (Signed)
Adequately controlled.  Continue losartan 50 mg once daily.  Check lab work.

## 2020-07-09 NOTE — Assessment & Plan Note (Signed)
Adequate control on omeprazole.  This will be refilled and she can continue omeprazole 40 mg once daily.

## 2020-07-09 NOTE — Patient Instructions (Addendum)
Nice to see you. Please quit smoking. We will get lab work today and contact you with results. I have ordered a mammogram.  Please call [facility] to get this scheduled. Please call your GYN to get a yearly exam scheduled for a Pap smear.

## 2020-07-09 NOTE — Assessment & Plan Note (Signed)
Colonoscopy is up-to-date.  Patient was advised she will be due in 2023 for follow-up colonoscopy.

## 2020-07-09 NOTE — Progress Notes (Signed)
Tommi Rumps, MD Phone: [phone]  Amy Day is a 43 y.o. female who presents today for f/u.  HYPERTENSION  Disease Monitoring  Home BP Monitoring not checking Chest pain- no    Dyspnea- no Medications  Compliance-  Taking losartan 50 mg daily.   Edema- no  GERD:   Reflux symptoms: well controlled on medication, only has them if she runs out of medication or eats the wrong thing   Abd pain: no   Blood in stool: no  Dysphagia: no   EGD: no  Medication: omeprazole 40 mg daily  Nicotine dependence, cigarettes: Patient is smoking about half a pack per day.  She has a plan in place to quit smoking in December.  Notes she had difficulty quitting previously as her husband continues to smoke.  He plans to quit smoking at the same time.  She had a little bit of feeling down on the Chantix recently.  She thinks she will need something to help her quit.  Social History   Tobacco Use  Smoking Status Current Every Day Smoker  . Types: E-cigarettes  . Last attempt to quit: 06/26/2004  . Years since quitting: 16.0  Smokeless Tobacco Never Used  Tobacco Comment   Vaping      ROS see history of present illness  Objective  Physical Exam Vitals:   07/09/20 0829  BP: 130/70  Pulse: 95  Temp: 98.4 F (36.9 C)  SpO2: 99%    BP Readings from Last 3 Encounters:  07/09/20 130/70  10/03/19 120/80  02/15/19 128/74   Wt Readings from Last 3 Encounters:  07/09/20 217 lb 9.6 oz (98.7 kg)  10/03/19 210 lb (95.3 kg)  02/15/19 211 lb (95.7 kg)    Physical Exam Constitutional:      General: She is not in acute distress.    Appearance: She is not diaphoretic.  Cardiovascular:     Rate and Rhythm: Normal rate and regular rhythm.     Heart sounds: Normal heart sounds.  Pulmonary:     Effort: Pulmonary effort is normal.     Breath sounds: Normal breath sounds.  Abdominal:     General: Bowel sounds are normal. There is no distension.     Palpations: Abdomen is soft.      Tenderness: There is no abdominal tenderness. There is no guarding or rebound.  Musculoskeletal:     Right lower leg: No edema.     Left lower leg: No edema.  Skin:    General: Skin is warm and dry.  Neurological:     Mental Status: She is alert.      Assessment/Plan: Please see individual problem list.  Problem List Items Addressed This Visit    Benign essential HTN - Primary    Adequately controlled.  Continue losartan 50 mg once daily.  Check lab work.      Relevant Orders   Basic Metabolic Panel (BMET)   Family history of colon cancer in mother    Colonoscopy is up-to-date.  Patient was advised she will be due in 2023 for follow-up colonoscopy.      GERD (gastroesophageal reflux disease)    Adequate control on omeprazole.  This will be refilled and she can continue omeprazole 40 mg once daily.      Nicotine dependence, cigarettes, uncomplicated    Smoking cessation counseling was provided.  Approximately 3 minutes were spent discussing the rationale for tobacco cessation and strategies for doing so.  Adjuncts, including varenicline and buproprion were  recommended.  Discussed continuing with her plan to quit smoking.  She will let us know if she would like medication management for this.  Follow-up in 6 months.       Obesity   Relevant Orders   HgB A1c    Other Visit Diagnoses    Need for immunization against influenza       Relevant Orders   Flu Vaccine QUAD 36+ mos IM (Completed)   Encounter for screening mammogram for malignant neoplasm of breast       Relevant Orders   MM 3D SCREEN BREAST BILATERAL       Health Maintenance: Patient will call to schedule her mammogram.  She will contact her GYN to get set up for a Pap smear.   This visit occurred during the SARS-CoV-2 public health emergency.  Safety protocols were in place, including screening questions prior to the visit, additional usage of staff PPE, and extensive cleaning of exam room while observing  appropriate contact time as indicated for disinfecting solutions.    Tommi Rumps, MD [place]

## 2020-07-19 ENCOUNTER — Other Ambulatory Visit: Payer: Self-pay | Admitting: Family Medicine

## 2020-07-19 DIAGNOSIS — K219 Gastro-esophageal reflux disease without esophagitis: Secondary | ICD-10-CM

## 2020-08-20 ENCOUNTER — Other Ambulatory Visit: Payer: Self-pay | Admitting: Family Medicine

## 2020-09-06 ENCOUNTER — Ambulatory Visit
Admission: RE | Admit: 2020-09-06 | Discharge: 2020-09-06 | Disposition: A | Discharge status: Home / Self Care | Payer: Commercial Managed Care - PPO | Source: Ambulatory Visit | Attending: Family Medicine | Admitting: Family Medicine

## 2020-09-06 ENCOUNTER — Other Ambulatory Visit: Payer: Self-pay

## 2020-09-06 DIAGNOSIS — Z1231 Encounter for screening mammogram for malignant neoplasm of breast: Secondary | ICD-10-CM | POA: Diagnosis not present

## 2020-09-07 ENCOUNTER — Ambulatory Visit
Admission: RE | Admit: 2020-09-07 | Discharge: 2020-09-07 | Disposition: A | Discharge status: Home / Self Care | Payer: Commercial Managed Care - PPO | Source: Ambulatory Visit | Attending: Family Medicine | Admitting: Family Medicine

## 2020-09-07 ENCOUNTER — Encounter: Payer: Self-pay | Admitting: Family Medicine

## 2020-09-07 ENCOUNTER — Other Ambulatory Visit: Payer: Self-pay | Admitting: Family Medicine

## 2020-09-07 DIAGNOSIS — N6489 Other specified disorders of breast: Secondary | ICD-10-CM

## 2020-09-07 DIAGNOSIS — R928 Other abnormal and inconclusive findings on diagnostic imaging of breast: Secondary | ICD-10-CM | POA: Diagnosis not present

## 2020-09-10 ENCOUNTER — Other Ambulatory Visit: Payer: Self-pay | Admitting: Family Medicine

## 2020-09-10 DIAGNOSIS — R928 Other abnormal and inconclusive findings on diagnostic imaging of breast: Secondary | ICD-10-CM

## 2020-09-10 DIAGNOSIS — N6320 Unspecified lump in the left breast, unspecified quadrant: Secondary | ICD-10-CM

## 2020-09-17 ENCOUNTER — Ambulatory Visit: Payer: Commercial Managed Care - PPO

## 2020-09-27 ENCOUNTER — Ambulatory Visit
Admission: RE | Admit: 2020-09-27 | Discharge: 2020-09-27 | Disposition: A | Discharge status: Home / Self Care | Payer: Commercial Managed Care - PPO | Source: Ambulatory Visit | Attending: Family Medicine | Admitting: Family Medicine

## 2020-09-27 ENCOUNTER — Other Ambulatory Visit: Payer: Self-pay

## 2020-09-27 DIAGNOSIS — N6320 Unspecified lump in the left breast, unspecified quadrant: Secondary | ICD-10-CM

## 2020-09-27 DIAGNOSIS — N6489 Other specified disorders of breast: Secondary | ICD-10-CM | POA: Insufficient documentation

## 2020-09-27 DIAGNOSIS — R928 Other abnormal and inconclusive findings on diagnostic imaging of breast: Secondary | ICD-10-CM | POA: Diagnosis present

## 2020-09-28 LAB — SURGICAL PATHOLOGY

## 2020-10-13 ENCOUNTER — Encounter: Payer: Self-pay | Admitting: Family Medicine

## 2020-10-15 MED ORDER — LOSARTAN POTASSIUM 50 MG PO TABS: 50.0000 mg | ORAL_TABLET | Freq: Every day | ORAL | 3 refills | Status: DC

## 2020-10-21 ENCOUNTER — Other Ambulatory Visit: Payer: Self-pay | Admitting: Family Medicine

## 2020-11-29 NOTE — Progress Notes (Signed)
Chief Complaint  Patient presents with  . Gynecologic Exam    No concerns     HPI:      [name] is a 45 y.o. [id number] who LMP was No LMP recorded. Patient has had a hysterectomy., presents today for her annual examination. Her menses are absent due to lap hyst/bilat salpingectomy for RLQ pain 2/19. Dysmenorrhea none, RLQ pain resolved. She does not have PMB. Few vasomotor sx.   Sex activity: single partner, contraception - tubal ligation/hyst. Has a little vaginal dryness, improved with lubricants. Last Pap: June 05, 2015  Results were: no abnormalities /neg HPV DNA   Last mammogram: 09/07/20 Results were cat 4 LT breast and RT breast cyst; 1/22 bx showed PASH LT breast; f/u bilat dx mammo due 7/22 at [facility] since pt works there.  There is a FH of breast cancer in her PGM, genetic testing not indicated. There is no FH of ovarian cancer. The patient does do self-breast exams.  Tobacco use: Smoking 1 ppd. Trying to quit. Alcohol use: rare  No drug use Exercise: min active  Colonoscopy: 2018 with polyp; repeat due after 5 yrs due to [place] of colon cancer in her mom. Initially was 10 yr f/u prior to [place] change.   She does get adequate calcium but not Vitamin D in her diet.  She has labs with her PCP.   Past Medical History:  Diagnosis Date  . Anxiety   . GERD (gastroesophageal reflux disease)   . Hypertension     Past Surgical History:  Procedure Laterality Date  . ABDOMINAL HYSTERECTOMY     RLQ pain; at [facility]  . ABLATION  2013  . BREAST REDUCTION SURGERY    . BREAST SURGERY  2012   Breast Reduction  . COLONOSCOPY WITH PROPOFOL N/A 06/25/2017   Procedure: COLONOSCOPY WITH PROPOFOL;  Surgeon: Lin Landsman, MD;  Location: [facility] ENDOSCOPY;  Service: Gastroenterology;  Laterality: N/A;  . REDUCTION MAMMAPLASTY     followed by breast lift  . TUBAL LIGATION  2006    Family History  Problem Relation Age of Onset  . Diabetes Father   . Heart attack Father    . Colon cancer Mother   . COPD Paternal Grandmother   . Breast cancer Paternal Grandmother 49    Social History   Socioeconomic History  . Marital status: Married    Spouse name: Not on file  . Number of children: Not on file  . Years of education: Not on file  . Highest education level: Not on file  Occupational History  . Not on file  Tobacco Use  . Smoking status: Current Every Day Smoker    Types: E-cigarettes    Last attempt to quit: 06/26/2004    Years since quitting: 16.4  . Smokeless tobacco: Never Used  . Tobacco comment: Vaping   Vaping Use  . Vaping Use: Every day  Substance and Sexual Activity  . Alcohol use: Yes    Alcohol/week: 0.0 standard drinks    Comment: Rare   . Drug use: No  . Sexual activity: Yes    Partners: Male    Birth control/protection: Surgical    Comment: Husband   Other Topics Concern  . Not on file  Social History Narrative   Married    Employed at [employer]    Some college- highest level    Caffeine- Coffee, tea, and occasional soda    Social Determinants of Health  Financial Resource Strain: Not on file  Food Insecurity: Not on file  Transportation Needs: Not on file  Physical Activity: Not on file  Stress: Not on file  Social Connections: Not on file  Intimate Partner Violence: Not on file    Current Outpatient Medications on File Prior to Visit  Medication Sig Dispense Refill  . cyclobenzaprine (FLEXERIL) 10 MG tablet Take 1 tablet (10 mg total) by mouth 3 (three) times daily as needed for muscle spasms. 30 tablet 0  . fluticasone (FLONASE) 50 MCG/ACT nasal spray Place 2 sprays into both nostrils daily. 16 g 6  . loratadine (CLARITIN) 10 MG tablet Take 1 tablet (10 mg total) by mouth daily. 30 tablet 11  . losartan (COZAAR) 50 MG tablet TAKE 1 TABLET(50 MG) BY MOUTH DAILY 30 tablet 3  . omeprazole (PRILOSEC) 40 MG capsule TAKE ONE CAPSULE BY MOUTH DAILY 90 capsule 1   No current facility-administered  medications on file prior to visit.      ROS:  Review of Systems  Constitutional: Negative for fatigue, fever and unexpected weight change.  Respiratory: Negative for cough, shortness of breath and wheezing.   Cardiovascular: Negative for chest pain, palpitations and leg swelling.  Gastrointestinal: Negative for blood in stool, constipation, diarrhea, nausea and vomiting.  Endocrine: Negative for cold intolerance, heat intolerance and polyuria.  Genitourinary: Negative for dyspareunia, dysuria, flank pain, frequency, genital sores, hematuria, menstrual problem, pelvic pain, urgency, vaginal bleeding, vaginal discharge and vaginal pain.  Musculoskeletal: Negative for back pain, joint swelling and myalgias.  Skin: Negative for rash.  Neurological: Negative for dizziness, syncope, light-headedness, numbness and headaches.  Hematological: Negative for adenopathy.  Psychiatric/Behavioral: Negative for agitation, confusion, sleep disturbance and suicidal ideas. The patient is not nervous/anxious.      Objective: BP 130/90   Ht 5\' 7"  (1.702 m)   Wt 219 lb (99.3 kg)   BMI 34.30 kg/m    Physical Exam Constitutional:      Appearance: She is well-developed.  Genitourinary:     Vulva normal.     Genitourinary Comments: UTERUS/CX SURG REM     Right Labia: No tenderness, lesions or skin changes.    Left Labia: No tenderness, lesions or skin changes.    Vaginal cuff intact.    No vaginal discharge, erythema or tenderness.      Right Adnexa: not tender and no mass present.    Left Adnexa: not tender and no mass present.    Cervix is absent.     Uterus is absent.  Breasts:     Right: No mass, nipple discharge, skin change or tenderness.     Left: No mass, nipple discharge, skin change or tenderness.    Neck:     Thyroid: No thyromegaly.  Cardiovascular:     Rate and Rhythm: Normal rate and regular rhythm.     Heart sounds: Normal heart sounds. No murmur heard.   Pulmonary:      Effort: Pulmonary effort is normal.     Breath sounds: Normal breath sounds.  Abdominal:     Palpations: Abdomen is soft.     Tenderness: There is no abdominal tenderness. There is no guarding.  Musculoskeletal:        General: Normal range of motion.     Cervical back: Normal range of motion.  Neurological:     General: No focal deficit present.     Mental Status: She is alert and oriented to person, place, and time.  Cranial Nerves: No cranial nerve deficit.  Skin:    General: Skin is warm and dry.  Psychiatric:        Mood and Affect: Mood normal.        Behavior: Behavior normal.        Thought Content: Thought content normal.        Judgment: Judgment normal.  Vitals reviewed.     Assessment/Plan: Encounter for annual routine gynecological examination  Cervical cancer screening - Plan: Cytology - PAP  Screening for HPV (human papillomavirus) - Plan: Cytology - PAP  Encounter for screening mammogram for malignant neoplasm of breast - Plan: MM DIAG BREAST TOMO BILATERAL, US BREAST LTD UNI LEFT INC AXILLA, US BREAST LTD UNI RIGHT INC AXILLA, f/u dx mammo bilat and bilat u/s due 7/22 at [facility]. Orders faxed.   Breast mass, left - Plan: MM DIAG BREAST TOMO BILATERAL, US BREAST LTD UNI LEFT INC AXILLA, US BREAST LTD UNI RIGHT INC AXILLA,   Breast mass, right - Plan: MM DIAG BREAST TOMO BILATERAL, US BREAST LTD UNI LEFT INC AXILLA, US BREAST LTD UNI RIGHT INC AXILLA,         GYN counsel breast self exam, mammography screening, adequate intake of calcium and vitamin D, diet and exercise, tob cessation     F/U  Return in about 1 year (around 11/30/2021).  Taden Witter B. Zahari Xiang, PA-C 11/30/2020 4:18 PM

## 2020-11-30 ENCOUNTER — Encounter: Payer: Self-pay | Admitting: Obstetrics and Gynecology

## 2020-11-30 ENCOUNTER — Other Ambulatory Visit: Payer: Self-pay

## 2020-11-30 ENCOUNTER — Other Ambulatory Visit (HOSPITAL_COMMUNITY)
Admission: RE | Admit: 2020-11-30 | Discharge: 2020-11-30 | Disposition: A | Discharge status: Home / Self Care | Payer: Commercial Managed Care - PPO | Source: Ambulatory Visit | Attending: Obstetrics and Gynecology | Admitting: Obstetrics and Gynecology

## 2020-11-30 ENCOUNTER — Ambulatory Visit (INDEPENDENT_AMBULATORY_CARE_PROVIDER_SITE_OTHER): Payer: Commercial Managed Care - PPO | Admitting: Obstetrics and Gynecology

## 2020-11-30 VITALS — BP 130/90 | Ht 67.0 in | Wt 219.0 lb

## 2020-11-30 DIAGNOSIS — Z124 Encounter for screening for malignant neoplasm of cervix: Secondary | ICD-10-CM

## 2020-11-30 DIAGNOSIS — Z1231 Encounter for screening mammogram for malignant neoplasm of breast: Secondary | ICD-10-CM

## 2020-11-30 DIAGNOSIS — N6310 Unspecified lump in the right breast, unspecified quadrant: Secondary | ICD-10-CM

## 2020-11-30 DIAGNOSIS — N6320 Unspecified lump in the left breast, unspecified quadrant: Secondary | ICD-10-CM

## 2020-11-30 DIAGNOSIS — Z1151 Encounter for screening for human papillomavirus (HPV): Secondary | ICD-10-CM

## 2020-11-30 DIAGNOSIS — Z01419 Encounter for gynecological examination (general) (routine) without abnormal findings: Secondary | ICD-10-CM

## 2020-11-30 NOTE — Patient Instructions (Addendum)
I value your feedback and you entrusting us with your care. If you get a [facility] patient survey, I would appreciate you taking the time to let us know about your experience today. Thank you!  [facility]: [phone]      

## 2020-12-04 LAB — CYTOLOGY - PAP
Comment: NEGATIVE
Diagnosis: NEGATIVE
High risk HPV: NEGATIVE

## 2020-12-20 ENCOUNTER — Other Ambulatory Visit: Payer: Self-pay

## 2020-12-26 ENCOUNTER — Ambulatory Visit (INDEPENDENT_AMBULATORY_CARE_PROVIDER_SITE_OTHER): Payer: Commercial Managed Care - PPO | Admitting: Family Medicine

## 2020-12-26 ENCOUNTER — Other Ambulatory Visit: Payer: Self-pay

## 2020-12-26 ENCOUNTER — Encounter: Payer: Self-pay | Admitting: Family Medicine

## 2020-12-26 DIAGNOSIS — K219 Gastro-esophageal reflux disease without esophagitis: Secondary | ICD-10-CM

## 2020-12-26 DIAGNOSIS — F17210 Nicotine dependence, cigarettes, uncomplicated: Secondary | ICD-10-CM | POA: Diagnosis not present

## 2020-12-26 DIAGNOSIS — L989 Disorder of the skin and subcutaneous tissue, unspecified: Secondary | ICD-10-CM | POA: Diagnosis not present

## 2020-12-26 DIAGNOSIS — I10 Essential (primary) hypertension: Secondary | ICD-10-CM | POA: Diagnosis not present

## 2020-12-26 HISTORY — DX: Disorder of the skin and subcutaneous tissue, unspecified: L98.9

## 2020-12-26 MED ORDER — BUPROPION HCL ER (SR) 150 MG PO TB12: ORAL_TABLET | ORAL | 1 refills | Status: DC

## 2020-12-26 MED ORDER — TRIAMCINOLONE ACETONIDE 0.1 % EX CREA: 1.0000 "application " | TOPICAL_CREAM | Freq: Two times a day (BID) | CUTANEOUS | 0 refills | Status: AC

## 2020-12-26 NOTE — Assessment & Plan Note (Signed)
Adequately controlled.  She will continue losartan 50 mg once daily.  Check labs.

## 2020-12-26 NOTE — Assessment & Plan Note (Signed)
The patient is interested in quitting smoking.  She is interested in a trial of Wellbutrin.  She denies a history of seizures.  We will start on Wellbutrin SR 150 mg once daily for 3 days and then increase to 150 mg twice daily.  Advised to monitor for any side effects.  Discussed picking a quit date and starting Wellbutrin about a week ahead of time.

## 2020-12-26 NOTE — Assessment & Plan Note (Addendum)
Undetermined cause though they do appear benign.  Will refer to dermatology.  We will trial a topical triamcinolone.  Advised not to use this for more than 2 weeks.  Discussed the risk of thinning of her skin with this type of treatment.  Advised not to apply to her face.

## 2020-12-26 NOTE — Patient Instructions (Signed)
Nice to see you. We will get labs today. Please try the triamcinolone on the skin lesions.  If they do not resolve you should see dermatology as we discussed.  A referral has been placed for you.

## 2020-12-26 NOTE — Progress Notes (Signed)
Amy Rumps, MD Phone: [phone]  Amy Day is a 45 y.o. female who presents today for f/u.  HYPERTENSION  Disease Monitoring  Home BP Monitoring in normal range per patient Chest pain- no    Dyspnea- no Medications  Compliance-  Taking losartan.  Edema- no  GERD:   Reflux symptoms: no   Abd pain: no   Blood in stool: no  Dysphagia: no    Medication: omeprazole  Skin spots: Patient notes a spot on her left dorsal forearm and the posterior aspect of her left ear.  These have been present for many years.  They have become scaly recently.  They have not enlarged.  There is no associated pain.  Tobacco abuse: Patient is interested in quitting smoking.  She is smoking 0.5-1 packs/day.  Social History   Tobacco Use  Smoking Status Current Every Day Smoker  . Types: E-cigarettes  . Last attempt to quit: 06/26/2004  . Years since quitting: 16.5  Smokeless Tobacco Never Used  Tobacco Comment   Vaping     Current Outpatient Medications on File Prior to Visit  Medication Sig Dispense Refill  . Cholecalciferol (VITAMIN D3) 75 MCG (3000 UT) TABS Take by mouth.    . cyclobenzaprine (FLEXERIL) 10 MG tablet Take 1 tablet (10 mg total) by mouth 3 (three) times daily as needed for muscle spasms. 30 tablet 0  . fluticasone (FLONASE) 50 MCG/ACT nasal spray Place 2 sprays into both nostrils daily. 16 g 6  . loratadine (CLARITIN) 10 MG tablet Take 1 tablet (10 mg total) by mouth daily. 30 tablet 11  . losartan (COZAAR) 50 MG tablet TAKE 1 TABLET(50 MG) BY MOUTH DAILY 30 tablet 3  . omeprazole (PRILOSEC) 40 MG capsule TAKE ONE CAPSULE BY MOUTH DAILY 90 capsule 1   No current facility-administered medications on file prior to visit.     ROS see history of present illness  Objective  Physical Exam Vitals:   12/26/20 1610  BP: 118/80  Pulse: 93  Temp: (!) 95.8 F (35.4 C)  SpO2: 99%    BP Readings from Last 3 Encounters:  12/26/20 118/80  11/30/20 130/90  07/09/20  130/70   Wt Readings from Last 3 Encounters:  12/26/20 220 lb 3.2 oz (99.9 kg)  11/30/20 219 lb (99.3 kg)  07/09/20 217 lb 9.6 oz (98.7 kg)    Physical Exam Constitutional:      General: She is not in acute distress.    Appearance: She is not diaphoretic.  HENT:     Ears:     Comments: Posterior left ear with 3 to 4 mm scaly spot Cardiovascular:     Rate and Rhythm: Normal rate and regular rhythm.     Heart sounds: Normal heart sounds.  Pulmonary:     Effort: Pulmonary effort is normal.     Breath sounds: Normal breath sounds.  Skin:    General: Skin is warm and dry.       Neurological:     Mental Status: She is alert.      Assessment/Plan: Please see individual problem list.  Problem List Items Addressed This Visit    Benign essential HTN    Adequately controlled.  She will continue losartan 50 mg once daily.  Check labs.      Relevant Orders   Comp Met (CMET)   Lipid panel   GERD (gastroesophageal reflux disease)    Adequately controlled.  Continue omeprazole 40 mg once daily.  Nicotine dependence, cigarettes, uncomplicated    The patient is interested in quitting smoking.  She is interested in a trial of Wellbutrin.  She denies a history of seizures.  We will start on Wellbutrin SR 150 mg once daily for 3 days and then increase to 150 mg twice daily.  Advised to monitor for any side effects.  Discussed picking a quit date and starting Wellbutrin about a week ahead of time.      Skin lesion    Undetermined cause though they do appear benign.  Will refer to dermatology.  We will trial a topical triamcinolone.  Advised not to use this for more than 2 weeks.  Discussed the risk of thinning of her skin with this type of treatment.  Advised not to apply to her face.      Relevant Medications   triamcinolone cream (KENALOG) 0.1 %   Other Relevant Orders   Ambulatory referral to Dermatology      This visit occurred during the SARS-CoV-2 public health  emergency.  Safety protocols were in place, including screening questions prior to the visit, additional usage of staff PPE, and extensive cleaning of exam room while observing appropriate contact time as indicated for disinfecting solutions.    Amy Rumps, MD [place]

## 2020-12-26 NOTE — Assessment & Plan Note (Signed)
Adequately controlled.  Continue omeprazole 40 mg once daily.

## 2020-12-27 LAB — COMPREHENSIVE METABOLIC PANEL
ALT: 17 U/L (ref 0–35)
AST: 14 U/L (ref 0–37)
Albumin: 4.4 g/dL (ref 3.5–5.2)
Alkaline Phosphatase: 101 U/L (ref 39–117)
BUN: 18 mg/dL (ref 6–23)
CO2: 23 mEq/L (ref 19–32)
Calcium: 9.6 mg/dL (ref 8.4–10.5)
Chloride: 105 mEq/L (ref 96–112)
Creatinine, Ser: 0.95 mg/dL (ref 0.40–1.20)
GFR: 72.93 mL/min (ref >60.00)
Glucose, Bld: 128 mg/dL — ABNORMAL HIGH (ref 70–99)
Potassium: 4 mEq/L (ref 3.5–5.1)
Sodium: 137 mEq/L (ref 135–145)
Total Bilirubin: 0.2 mg/dL (ref 0.2–1.2)
Total Protein: 6.9 g/dL (ref 6.0–8.3)

## 2020-12-27 LAB — LDL CHOLESTEROL, DIRECT: Direct LDL: 139 mg/dL

## 2020-12-27 LAB — LIPID PANEL
Cholesterol: 206 mg/dL — ABNORMAL HIGH (ref 0–200)
HDL: 55.8 mg/dL (ref >39.00)
NonHDL: 150.68
Total CHOL/HDL Ratio: 4
Triglycerides: 224 mg/dL — ABNORMAL HIGH (ref 0.0–149.0)
VLDL: 44.8 mg/dL — ABNORMAL HIGH (ref 0.0–40.0)

## 2021-01-15 ENCOUNTER — Other Ambulatory Visit: Payer: Self-pay

## 2021-01-15 ENCOUNTER — Encounter: Payer: Self-pay | Admitting: Family Medicine

## 2021-01-15 DIAGNOSIS — K219 Gastro-esophageal reflux disease without esophagitis: Secondary | ICD-10-CM

## 2021-01-15 MED ORDER — OMEPRAZOLE 40 MG PO CPDR: 1.0000 | DELAYED_RELEASE_CAPSULE | Freq: Every day | ORAL | 1 refills | Status: DC

## 2021-01-15 NOTE — Telephone Encounter (Signed)
Patient is going to use the app on her phone

## 2021-03-06 ENCOUNTER — Telehealth: Payer: Self-pay | Admitting: Family Medicine

## 2021-03-06 ENCOUNTER — Encounter: Payer: Self-pay | Admitting: Obstetrics and Gynecology

## 2021-03-06 NOTE — Telephone Encounter (Signed)
Lft vm for pt to call ofc .thanks

## 2021-05-30 ENCOUNTER — Other Ambulatory Visit: Payer: Self-pay | Admitting: Family Medicine

## 2021-06-05 ENCOUNTER — Ambulatory Visit: Payer: Commercial Managed Care - PPO | Admitting: Family Medicine

## 2021-06-05 ENCOUNTER — Encounter: Payer: Self-pay | Admitting: Family Medicine

## 2021-06-05 ENCOUNTER — Other Ambulatory Visit: Payer: Self-pay

## 2021-06-05 ENCOUNTER — Other Ambulatory Visit: Payer: Self-pay | Admitting: Family Medicine

## 2021-06-05 VITALS — BP 130/88 | HR 93 | Temp 96.7°F | Ht 67.01 in | Wt 220.4 lb

## 2021-06-05 DIAGNOSIS — F17210 Nicotine dependence, cigarettes, uncomplicated: Secondary | ICD-10-CM | POA: Diagnosis not present

## 2021-06-05 DIAGNOSIS — I10 Essential (primary) hypertension: Secondary | ICD-10-CM

## 2021-06-05 DIAGNOSIS — Z23 Encounter for immunization: Secondary | ICD-10-CM

## 2021-06-05 DIAGNOSIS — E6609 Other obesity due to excess calories: Secondary | ICD-10-CM | POA: Diagnosis not present

## 2021-06-05 DIAGNOSIS — Z6834 Body mass index (BMI) 34.0-34.9, adult: Secondary | ICD-10-CM

## 2021-06-05 LAB — BASIC METABOLIC PANEL
BUN: 19 mg/dL (ref 6–23)
CO2: 24 mEq/L (ref 19–32)
Calcium: 9.6 mg/dL (ref 8.4–10.5)
Chloride: 104 mEq/L (ref 96–112)
Creatinine, Ser: 0.87 mg/dL (ref 0.40–1.20)
GFR: 80.8 mL/min (ref >60.00)
Glucose, Bld: 105 mg/dL — ABNORMAL HIGH (ref 70–99)
Potassium: 4.5 mEq/L (ref 3.5–5.1)
Sodium: 139 mEq/L (ref 135–145)

## 2021-06-05 MED ORDER — VALSARTAN 160 MG PO TABS: 160.0000 mg | ORAL_TABLET | Freq: Every day | ORAL | 3 refills | Status: DC

## 2021-06-05 NOTE — Patient Instructions (Addendum)
Nice to see you. We are can get lab work today and then likely change her blood pressure medicine to something that may be a little more effective.  If you notice any lightheadedness or any other side effects with this switch please let me know. You will have lab work and a blood pressure check in 10 days.  I will see you back in about 3 months.

## 2021-06-05 NOTE — Assessment & Plan Note (Signed)
Above goal.  We will get a BMP today and if her kidney function and potassium are adequate we will switch her over to valsartan.  She will return in 10 days for labs and a BP check.  She will follow-up with me in 3 months.

## 2021-06-05 NOTE — Assessment & Plan Note (Signed)
>>  ASSESSMENT AND PLAN FOR OBESITY WRITTEN ON 06/05/2021  8:29 AM BY SONNENBERG, ERIC G, MD  Encouraged healthy diet and continued activity.

## 2021-06-05 NOTE — Assessment & Plan Note (Signed)
Encouraged continued efforts to quit smoking.  Discussed behavioral changes to try to get off that last cigarette.  Follow-up in 3 months.

## 2021-06-05 NOTE — Progress Notes (Signed)
Tommi Rumps, MD Phone: [phone]  Amy Day is a 45 y.o. female who presents today for f/u.  HYPERTENSION Disease Monitoring Home BP Monitoring similar to today Chest pain- no    Dyspnea- no Medications Compliance-  taking losartan.   Edema- no BMET    Component Value Date/Time   NA 137 12/26/2020 1631   K 4.0 12/26/2020 1631   CL 105 12/26/2020 1631   CO2 23 12/26/2020 1631   GLUCOSE 128 (H) 12/26/2020 1631   BUN 18 12/26/2020 1631   CREATININE 0.95 12/26/2020 1631   CREATININE 0.90 09/10/2018 1527   CALCIUM 9.6 12/26/2020 1631   Tobacco abuse: Patient continues to smoke 1 cigarette/day.  She discontinued Wellbutrin as it was not beneficial.  She is working on finding things to keep her occupied so that she does not desire that 1 cigarette.  Obesity: The patient has been trying to eat more vegetables.  She does not eat much salt.  She is remaining active with badminton though no specific exercise.  Social History   Tobacco Use  Smoking Status Every Day   Types: E-cigarettes   Last attempt to quit: 06/26/2004   Years since quitting: 16.9  Smokeless Tobacco Never  Tobacco Comments   Vaping     Current Outpatient Medications on File Prior to Visit  Medication Sig Dispense Refill   Cholecalciferol (VITAMIN D3) 75 MCG (3000 UT) TABS Take by mouth.     cyclobenzaprine (FLEXERIL) 10 MG tablet Take 1 tablet (10 mg total) by mouth 3 (three) times daily as needed for muscle spasms. 30 tablet 0   fluticasone (FLONASE) 50 MCG/ACT nasal spray Place 2 sprays into both nostrils daily. 16 g 6   loratadine (CLARITIN) 10 MG tablet Take 1 tablet (10 mg total) by mouth daily. 30 tablet 11   losartan (COZAAR) 50 MG tablet TAKE 1 TABLET(50 MG) BY MOUTH DAILY 30 tablet 3   omeprazole (PRILOSEC) 40 MG capsule Take 1 capsule (40 mg total) by mouth daily. 90 capsule 1   triamcinolone cream (KENALOG) 0.1 % Apply 1 application topically 2 (two) times daily. 30 g 0   buPROPion  (WELLBUTRIN SR) 150 MG 12 hr tablet Take 1 tablet (150 mg total) by mouth daily for 3 days, THEN 1 tablet (150 mg total) 2 (two) times daily. (Patient not taking: Reported on 06/05/2021) 180 tablet 1   No current facility-administered medications on file prior to visit.     ROS see history of present illness  Objective  Physical Exam Vitals:   06/05/21 0817  BP: 130/88  Pulse: 93  Temp: (!) 96.7 F (35.9 C)  SpO2: 99%    BP Readings from Last 3 Encounters:  06/05/21 130/88  12/26/20 118/80  11/30/20 130/90   Wt Readings from Last 3 Encounters:  06/05/21 220 lb 6.4 oz (100 kg)  12/26/20 220 lb 3.2 oz (99.9 kg)  11/30/20 219 lb (99.3 kg)    Physical Exam Constitutional:      General: She is not in acute distress.    Appearance: She is not diaphoretic.  Cardiovascular:     Rate and Rhythm: Normal rate and regular rhythm.     Heart sounds: Normal heart sounds.  Pulmonary:     Effort: Pulmonary effort is normal.     Breath sounds: Normal breath sounds.  Musculoskeletal:     Right lower leg: No edema.     Left lower leg: No edema.  Skin:    General: Skin is warm  and dry.  Neurological:     Mental Status: She is alert.     Assessment/Plan: Please see individual problem list.  Problem List Items Addressed This Visit     Benign essential HTN - Primary    Above goal.  We will get a BMP today and if her kidney function and potassium are adequate we will switch her over to valsartan.  She will return in 10 days for labs and a BP check.  She will follow-up with me in 3 months.      Relevant Orders   Basic Metabolic Panel (BMET)   Basic Metabolic Panel (BMET)   Nicotine dependence, cigarettes, uncomplicated    Encouraged continued efforts to quit smoking.  Discussed behavioral changes to try to get off that last cigarette.  Follow-up in 3 months.      Obesity    Encouraged healthy diet and continued activity.      Other Visit Diagnoses     Need for  immunization against influenza       Relevant Orders   Flu Vaccine QUAD 12mo+IM (Fluarix, Fluzone & Alfiuria Quad PF) (Completed)        Health Maintenance: flu vaccine given today. Letter for work given to document this.   Return in about 10 days (around 06/15/2021) for Labs and nurse BP check, 3 months with PCP for hypertension.  This visit occurred during the SARS-CoV-2 public health emergency.  Safety protocols were in place, including screening questions prior to the visit, additional usage of staff PPE, and extensive cleaning of exam room while observing appropriate contact time as indicated for disinfecting solutions.    Tommi Rumps, MD [place]

## 2021-06-05 NOTE — Assessment & Plan Note (Signed)
Encouraged healthy diet and continued activity.

## 2021-06-17 ENCOUNTER — Other Ambulatory Visit (INDEPENDENT_AMBULATORY_CARE_PROVIDER_SITE_OTHER): Payer: Commercial Managed Care - PPO

## 2021-06-17 ENCOUNTER — Ambulatory Visit (INDEPENDENT_AMBULATORY_CARE_PROVIDER_SITE_OTHER): Payer: Commercial Managed Care - PPO | Admitting: *Deleted

## 2021-06-17 ENCOUNTER — Other Ambulatory Visit: Payer: Self-pay

## 2021-06-17 VITALS — BP 126/78 | HR 83 | Temp 98.6°F | Resp 14

## 2021-06-17 DIAGNOSIS — I10 Essential (primary) hypertension: Secondary | ICD-10-CM | POA: Diagnosis not present

## 2021-06-17 NOTE — Progress Notes (Signed)
Patient here for nurse visit BP check per order from 06/05/2021.   Patient reports compliance with prescribed BP medications: yes Valsartan 160 Mg    Last dose of BP medication: 7 AM  BP Readings from Last 3 Encounters:  06/17/21 126/78  06/05/21 130/88  12/26/20 118/80   Pulse Readings from Last 3 Encounters:  06/17/21 83  06/05/21 93  12/26/20 93      Patient verbalized understanding of instructions.   Kerin Salen, RN

## 2021-06-18 LAB — BASIC METABOLIC PANEL
BUN: 18 mg/dL (ref 6–23)
CO2: 27 mEq/L (ref 19–32)
Calcium: 9.3 mg/dL (ref 8.4–10.5)
Chloride: 104 mEq/L (ref 96–112)
Creatinine, Ser: 1.08 mg/dL (ref 0.40–1.20)
GFR: 62.32 mL/min (ref >60.00)
Glucose, Bld: 107 mg/dL — ABNORMAL HIGH (ref 70–99)
Potassium: 4.1 mEq/L (ref 3.5–5.1)
Sodium: 138 mEq/L (ref 135–145)

## 2021-06-25 NOTE — Progress Notes (Signed)
I have reviewed the above note and agree. BP is acceptable. She can continue on the valsartan.   Tommi Rumps, M.D.

## 2021-06-26 ENCOUNTER — Encounter: Payer: Self-pay | Admitting: *Deleted

## 2021-06-26 NOTE — Progress Notes (Signed)
Mychart message sent to patient.

## 2021-07-08 ENCOUNTER — Other Ambulatory Visit: Payer: Self-pay | Admitting: Family Medicine

## 2021-07-08 DIAGNOSIS — K219 Gastro-esophageal reflux disease without esophagitis: Secondary | ICD-10-CM

## 2021-08-06 ENCOUNTER — Other Ambulatory Visit: Payer: Self-pay

## 2021-08-06 ENCOUNTER — Encounter: Payer: Self-pay | Admitting: Family Medicine

## 2021-08-06 ENCOUNTER — Ambulatory Visit (INDEPENDENT_AMBULATORY_CARE_PROVIDER_SITE_OTHER): Payer: Commercial Managed Care - PPO | Admitting: Family Medicine

## 2021-08-06 ENCOUNTER — Telehealth: Payer: Self-pay | Admitting: Family Medicine

## 2021-08-06 VITALS — BP 130/80 | HR 100 | Temp 98.5°F | Ht 67.0 in | Wt 222.4 lb

## 2021-08-06 DIAGNOSIS — F17210 Nicotine dependence, cigarettes, uncomplicated: Secondary | ICD-10-CM

## 2021-08-06 DIAGNOSIS — N63 Unspecified lump in unspecified breast: Secondary | ICD-10-CM | POA: Diagnosis not present

## 2021-08-06 DIAGNOSIS — I10 Essential (primary) hypertension: Secondary | ICD-10-CM

## 2021-08-06 HISTORY — DX: Unspecified lump in unspecified breast: N63.0

## 2021-08-06 LAB — BASIC METABOLIC PANEL
BUN: 18 mg/dL (ref 6–23)
CO2: 24 mEq/L (ref 19–32)
Calcium: 9.7 mg/dL (ref 8.4–10.5)
Chloride: 105 mEq/L (ref 96–112)
Creatinine, Ser: 0.88 mg/dL (ref 0.40–1.20)
GFR: 79.6 mL/min (ref >60.00)
Glucose, Bld: 102 mg/dL — ABNORMAL HIGH (ref 70–99)
Potassium: 3.9 mEq/L (ref 3.5–5.1)
Sodium: 135 mEq/L (ref 135–145)

## 2021-08-06 MED ORDER — VARENICLINE TARTRATE 0.5 MG X 11 & 1 MG X 42 PO TBPK
ORAL_TABLET | ORAL | 0 refills | Status: DC
Start: 2021-08-06 — End: 2022-02-05

## 2021-08-06 NOTE — Progress Notes (Signed)
Tommi Rumps, MD Phone: [phone]  Amy Day is a 45 y.o. female who presents today for f/u.  HYPERTENSION Disease Monitoring Home BP Monitoring not checking Chest pain- no    Dyspnea- no Medications Compliance-  taking valsartan. Lightheadedness- no  Edema- no BMET    Component Value Date/Time   NA 138 06/17/2021 1552   K 4.1 06/17/2021 1552   CL 104 06/17/2021 1552   CO2 27 06/17/2021 1552   GLUCOSE 107 (H) 06/17/2021 1552   BUN 18 06/17/2021 1552   CREATININE 1.08 06/17/2021 1552   CREATININE 0.90 09/10/2018 1527   CALCIUM 9.3 06/17/2021 1552   Tobacco abuse: increased smoking back up to 3-4 cigarettes per day. No specific reason. She is interested in quitting. She would like to retry chantix.   Social History   Tobacco Use  Smoking Status Every Day   Types: E-cigarettes   Last attempt to quit: 06/26/2004   Years since quitting: 17.1  Smokeless Tobacco Never  Tobacco Comments   Vaping     Current Outpatient Medications on File Prior to Visit  Medication Sig Dispense Refill   Cholecalciferol (VITAMIN D3) 75 MCG (3000 UT) TABS Take by mouth.     cyclobenzaprine (FLEXERIL) 10 MG tablet Take 1 tablet (10 mg total) by mouth 3 (three) times daily as needed for muscle spasms. 30 tablet 0   fluticasone (FLONASE) 50 MCG/ACT nasal spray Place 2 sprays into both nostrils daily. 16 g 6   loratadine (CLARITIN) 10 MG tablet Take 1 tablet (10 mg total) by mouth daily. 30 tablet 11   omeprazole (PRILOSEC) 40 MG capsule TAKE ONE CAPSULE BY MOUTH DAILY 90 capsule 1   triamcinolone cream (KENALOG) 0.1 % Apply 1 application topically 2 (two) times daily. 30 g 0   valsartan (DIOVAN) 160 MG tablet Take 1 tablet (160 mg total) by mouth daily. 90 tablet 3   No current facility-administered medications on file prior to visit.     ROS see history of present illness  Objective  Physical Exam Vitals:   08/06/21 0806  BP: 130/80  Pulse: 100  Temp: 98.5 F (36.9 C)   SpO2: 99%    BP Readings from Last 3 Encounters:  08/06/21 130/80  06/17/21 126/78  06/05/21 130/88   Wt Readings from Last 3 Encounters:  08/06/21 222 lb 6.4 oz (100.9 kg)  06/05/21 220 lb 6.4 oz (100 kg)  12/26/20 220 lb 3.2 oz (99.9 kg)    Physical Exam Constitutional:      General: She is not in acute distress.    Appearance: She is not diaphoretic.  Cardiovascular:     Rate and Rhythm: Normal rate and regular rhythm.     Heart sounds: Normal heart sounds.  Pulmonary:     Effort: Pulmonary effort is normal.     Breath sounds: Normal breath sounds.  Musculoskeletal:     Right lower leg: No edema.     Left lower leg: No edema.  Skin:    General: Skin is warm and dry.  Neurological:     Mental Status: She is alert.     Assessment/Plan: Please see individual problem list.  Problem List Items Addressed This Visit     Benign essential HTN - Primary    Well-controlled.  Will continue valsartan 160 mg once daily.  Check BMP to follow-up on kidney function.      Relevant Orders   Basic Metabolic Panel (BMET)   Mass of breast    Prior  benign mass.  Patient appears to be due for follow-up imaging.  This was ordered to be completed at [facility].      Relevant Orders   MM DIAG BREAST TOMO BILATERAL   US BREAST LTD UNI RIGHT INC AXILLA   US BREAST LTD UNI LEFT INC AXILLA   Nicotine dependence, cigarettes, uncomplicated    Encouraged tobacco cessation.  We will trial of Chantix.  Advised to monitor for anxiety, depression, and abnormal dreams.  If she notices any side effects she can discontinue the Chantix.      Relevant Medications   varenicline (CHANTIX PAK) 0.5 MG X 11 & 1 MG X 42 tablet    Return in about 6 months (around 02/04/2022) for Hypertension.  This visit occurred during the SARS-CoV-2 public health emergency.  Safety protocols were in place, including screening questions prior to the visit, additional usage of staff PPE, and extensive cleaning of exam room  while observing appropriate contact time as indicated for disinfecting solutions.    Tommi Rumps, MD [place]

## 2021-08-06 NOTE — Assessment & Plan Note (Signed)
Prior benign mass.  Patient appears to be due for follow-up imaging.  This was ordered to be completed at [facility].

## 2021-08-06 NOTE — Assessment & Plan Note (Signed)
Encouraged tobacco cessation.  We will trial of Chantix.  Advised to monitor for anxiety, depression, and abnormal dreams.  If she notices any side effects she can discontinue the Chantix.

## 2021-08-06 NOTE — Assessment & Plan Note (Signed)
Well-controlled.  Will continue valsartan 160 mg once daily.  Check BMP to follow-up on kidney function.

## 2021-08-06 NOTE — Telephone Encounter (Signed)
Lft pt vm to call ofc regarding Diag mammo and [place]. thanks

## 2021-08-06 NOTE — Patient Instructions (Signed)
Nice to see. We will get lab work today. Somebody should call you to schedule your mammogram. If you do not hear anything please let us know. Please start the Chantix.  If you notice any side effects please let us know as well.

## 2022-01-06 ENCOUNTER — Ambulatory Visit
Admission: RE | Admit: 2022-01-06 | Discharge: 2022-01-06 | Disposition: A | Discharge status: Home / Self Care | Payer: Commercial Managed Care - PPO | Source: Ambulatory Visit | Attending: Family Medicine | Admitting: Family Medicine

## 2022-01-06 VITALS — BP 117/84 | HR 96 | Temp 98.2°F | Resp 16

## 2022-01-06 DIAGNOSIS — J209 Acute bronchitis, unspecified: Secondary | ICD-10-CM

## 2022-01-06 MED ORDER — PREDNISONE 20 MG PO TABS: 40.0000 mg | ORAL_TABLET | Freq: Every day | ORAL | 0 refills | Status: DC

## 2022-01-06 MED ORDER — PROMETHAZINE-DM 6.25-15 MG/5ML PO SYRP
5.0000 mL | ORAL_SOLUTION | Freq: Three times a day (TID) | ORAL | 0 refills | Status: DC | PRN
Start: 2022-01-06 — End: 2022-02-05

## 2022-01-06 NOTE — ED Triage Notes (Signed)
Pt c/o cough, wheezing, chest congestion, watery eyes x 5 days  ?

## 2022-01-06 NOTE — ED Provider Notes (Signed)
?[facility] ? ? ? ?CSN: [id number] ?Arrival date & time: 01/06/22  1349 ? ? ?  ? ?History   ?Chief Complaint ?Chief Complaint  ?Patient presents with  ? Cough  ?  wheezing/cough - Entered by patient  ? Nasal Congestion  ? ? ?HPI ?Amy Day is a 46 y.o. female.  ? ?HPI ?Patient presents today with a five day history of URI symptoms of cough, congestion, chest congestion, wheezing, watery/itchy eyes. No fever. No known sick contacts. Denies history of asthma. Patient is a daily smoker. ?Past Medical History:  ?Diagnosis Date  ? Anxiety   ? GERD (gastroesophageal reflux disease)   ? Hypertension   ? ? ?Patient Active Problem List  ? Diagnosis Date Noted  ? Mass of breast 08/06/2021  ? Skin lesion 12/26/2020  ? Family history of colon cancer in mother 07/09/2020  ? Cervical radiculopathy 10/03/2019  ? Pain of upper abdomen 11/17/2018  ? Diarrhea 11/17/2018  ? Nicotine dependence, cigarettes, uncomplicated 63/09/6008  ? Anxiety 12/11/2017  ? Abdominal pain, chronic, right lower quadrant 08/12/2016  ? Palpitations 12/11/2015  ? Obesity 06/27/2015  ? Benign essential HTN 06/27/2015  ? GERD (gastroesophageal reflux disease) 06/27/2015  ? ? ?Past Surgical History:  ?Procedure Laterality Date  ? ABDOMINAL HYSTERECTOMY    ? RLQ pain; at [facility]  ? ABLATION  2013  ? BREAST REDUCTION SURGERY    ? BREAST SURGERY  2012  ? Breast Reduction  ? COLONOSCOPY WITH PROPOFOL N/A 06/25/2017  ? Procedure: COLONOSCOPY WITH PROPOFOL;  Surgeon: Lin Landsman, MD;  Location: [facility] ENDOSCOPY;  Service: Gastroenterology;  Laterality: N/A;  ? REDUCTION MAMMAPLASTY    ? followed by breast lift  ? TUBAL LIGATION  2006  ? ? ?OB History   ? ? Gravida  ?2  ? Para  ?2  ? Term  ?2  ? Preterm  ?   ? AB  ?   ? Living  ?2  ?  ? ? SAB  ?   ? IAB  ?   ? Ectopic  ?   ? Multiple  ?   ? Live Births  ?2  ?   ?  ?  ? ? ? ?Home Medications   ? ?Prior to Admission medications   ?Medication Sig Start Date End Date Taking? Authorizing Provider   ?predniSONE (DELTASONE) 20 MG tablet Take 2 tablets (40 mg total) by mouth daily with breakfast. 01/06/22  Yes Scot Jun, FNP  ?promethazine-dextromethorphan (PROMETHAZINE-DM) 6.25-15 MG/5ML syrup Take 5 mLs by mouth 3 (three) times daily as needed for cough. 01/06/22  Yes Scot Jun, FNP  ?Cholecalciferol (VITAMIN D3) 75 MCG (3000 UT) TABS Take by mouth.    [provider]  ?cyclobenzaprine (FLEXERIL) 10 MG tablet Take 1 tablet (10 mg total) by mouth 3 (three) times daily as needed for muscle spasms. 10/03/19   Leone Haven, MD  ?fluticasone Asencion Islam) 50 MCG/ACT nasal spray Place 2 sprays into both nostrils daily. 10/01/17   Leone Haven, MD  ?loratadine (CLARITIN) 10 MG tablet Take 1 tablet (10 mg total) by mouth daily. 10/01/17   Leone Haven, MD  ?omeprazole (PRILOSEC) 40 MG capsule TAKE ONE CAPSULE BY MOUTH DAILY 07/10/21   Leone Haven, MD  ?triamcinolone cream (KENALOG) 0.1 % Apply 1 application topically 2 (two) times daily. 12/26/20   Leone Haven, MD  ?valsartan (DIOVAN) 160 MG tablet Take 1 tablet (160 mg total) by mouth daily. 06/05/21  Leone Haven, MD  ?varenicline (CHANTIX PAK) 0.5 MG X 11 & 1 MG X 42 tablet Take one 0.5 mg tablet by mouth once daily for 3 days, then increase to one 0.5 mg tablet twice daily for 4 days, then increase to one 1 mg tablet twice daily. 08/06/21   Leone Haven, MD  ? ? ?Family History ?Family History  ?Problem Relation Age of Onset  ? Diabetes Father   ? Heart attack Father   ? Colon cancer Mother   ? COPD Paternal Grandmother   ? Breast cancer Paternal Grandmother 80  ? ? ?Social History ?Social History  ? ?Tobacco Use  ? Smoking status: Every Day  ?  Types: Cigarettes  ? Smokeless tobacco: Never  ? Tobacco comments:  ?  Vaping   ?Vaping Use  ? Vaping Use: Former  ?Substance Use Topics  ? Alcohol use: Yes  ?  Alcohol/week: 0.0 standard drinks  ?  Comment: Rare   ? Drug use: No  ? ? ? ?Allergies   ?Patient has no  known allergies. ? ? ?Review of Systems ?Review of Systems ?Pertinent negatives listed in HPI  ? ?Physical Exam ?Triage Vital Signs ?ED Triage Vitals [01/06/22 1407]  ?Enc Vitals Group  ?   BP   ?   Pulse   ?   Resp   ?   Temp   ?   Temp src   ?   SpO2   ?   Weight   ?   Height   ?   Head Circumference   ?   Peak Flow   ?   Pain Score 0  ?   Pain Loc   ?   Pain Edu?   ?   Excl. in [place]?   ? ?No data found. ? ?Updated Vital Signs ?BP 117/84 (BP Location: Left Arm)   Pulse 96   Temp 98.2 ?F (36.8 ?C) (Oral)   Resp 16   SpO2 96%  ? ?Visual Acuity ?Right Eye Distance:   ?Left Eye Distance:   ?Bilateral Distance:   ? ?Right Eye Near:   ?Left Eye Near:    ?Bilateral Near:    ? ?Physical Exam ? ?General Appearance:    Alert, cooperative, no distress  ?HENT:   Normocephalic, ears normal, nares with congestion, oropharynx  patent   ?Eyes:    PERRL, conjunctiva/corneas clear, EOM's intact       ?Lungs:     Clear to auscultation bilaterally, respirations unlabored  ?Heart:    Regular rate and rhythm  ?Neurologic:   Awake, alert, oriented x 3. No apparent focal neurological           defect.   ?  ? ?UC Treatments / Results  ?Labs ?(all labs ordered are listed, but only abnormal results are displayed) ?Labs Reviewed - No data to display ? ?EKG ? ? ?Radiology ?No results found. ? ?Procedures ?Procedures (including critical care time) ? ?Medications Ordered in UC ?Medications - No data to display ? ?Initial Impression / Assessment and Plan / UC Course  ?I have reviewed the triage vital signs and the nursing notes. ? ?Pertinent labs & imaging results that were available during my care of the patient were reviewed by me and considered in my medical decision making (see chart for details). ? ?  ? ?Acute Bronchitis, no evidence of any bacterial related symptoms.  ?Treatment with prednisone 40 mg daily x 5 days ?Promethazine DM  for cough TIDPRN ?Hydrate  well with water. ?Follow-up with PCP or RTC as needed. ?Final Clinical  Impressions(s) / UC Diagnoses  ? ?Final diagnoses:  ?Acute bronchitis, unspecified organism  ? ?Discharge Instructions   ?None ?  ? ?ED Prescriptions   ? ? Medication Sig Dispense Auth. Provider  ? predniSONE (DELTASONE) 20 MG tablet Take 2 tablets (40 mg total) by mouth daily with breakfast. 10 tablet Scot Jun, FNP  ? promethazine-dextromethorphan (PROMETHAZINE-DM) 6.25-15 MG/5ML syrup Take 5 mLs by mouth 3 (three) times daily as needed for cough. 180 mL Scot Jun, FNP  ? ?  ? ?PDMP not reviewed this encounter. ?  ?Scot Jun, FNP ?01/07/22 2116 ? ?

## 2022-01-07 ENCOUNTER — Encounter: Payer: Self-pay | Admitting: Family Medicine

## 2022-01-08 NOTE — Telephone Encounter (Signed)
Please see the MyChart message reply(ies) for my assessment and plan. ? ?The patient gave consent for this Medical Advice Message and is aware that it may result in a bill to their insurance company as well as the possibility that this may result in a co-payment or deductible. They are an established patient, but are not seeking medical advice exclusively about a problem treated during an in person or video visit in the last 7 days. I did not recommend an in person or video visit within 7 days of my reply. ? ?I spent a total of                   minutes cumulative time within 7 days through [employer] ?Tommi Rumps, MD ? ?

## 2022-01-13 ENCOUNTER — Other Ambulatory Visit: Payer: Self-pay | Admitting: Family Medicine

## 2022-01-13 DIAGNOSIS — K219 Gastro-esophageal reflux disease without esophagitis: Secondary | ICD-10-CM

## 2022-02-05 ENCOUNTER — Ambulatory Visit (INDEPENDENT_AMBULATORY_CARE_PROVIDER_SITE_OTHER): Payer: Commercial Managed Care - PPO | Admitting: Family Medicine

## 2022-02-05 ENCOUNTER — Encounter: Payer: Self-pay | Admitting: Family Medicine

## 2022-02-05 DIAGNOSIS — I10 Essential (primary) hypertension: Secondary | ICD-10-CM

## 2022-02-05 DIAGNOSIS — Z87891 Personal history of nicotine dependence: Secondary | ICD-10-CM | POA: Diagnosis not present

## 2022-02-05 DIAGNOSIS — K219 Gastro-esophageal reflux disease without esophagitis: Secondary | ICD-10-CM

## 2022-02-05 LAB — LIPID PANEL
Cholesterol: 208 mg/dL — ABNORMAL HIGH (ref 0–200)
HDL: 61.4 mg/dL (ref >39.00)
LDL Cholesterol: 129 mg/dL — ABNORMAL HIGH (ref 0–99)
NonHDL: 146.18
Total CHOL/HDL Ratio: 3
Triglycerides: 84 mg/dL (ref 0.0–149.0)
VLDL: 16.8 mg/dL (ref 0.0–40.0)

## 2022-02-05 LAB — COMPREHENSIVE METABOLIC PANEL
ALT: 26 U/L (ref 0–35)
AST: 19 U/L (ref 0–37)
Albumin: 4.2 g/dL (ref 3.5–5.2)
Alkaline Phosphatase: 92 U/L (ref 39–117)
BUN: 17 mg/dL (ref 6–23)
CO2: 25 mEq/L (ref 19–32)
Calcium: 9.8 mg/dL (ref 8.4–10.5)
Chloride: 104 mEq/L (ref 96–112)
Creatinine, Ser: 0.82 mg/dL (ref 0.40–1.20)
GFR: 86.34 mL/min (ref >60.00)
Glucose, Bld: 100 mg/dL — ABNORMAL HIGH (ref 70–99)
Potassium: 4.3 mEq/L (ref 3.5–5.1)
Sodium: 139 mEq/L (ref 135–145)
Total Bilirubin: 0.5 mg/dL (ref 0.2–1.2)
Total Protein: 6.6 g/dL (ref 6.0–8.3)

## 2022-02-05 LAB — HEMOGLOBIN A1C: Hgb A1c MFr Bld: 5.9 % (ref 4.6–6.5)

## 2022-02-05 NOTE — Assessment & Plan Note (Signed)
Congratulated on smoking cessation.  

## 2022-02-05 NOTE — Progress Notes (Signed)
Tommi Rumps, MD Phone: [phone]  Amy Day is a 46 y.o. female who presents today for f/u.  HYPERTENSION Disease Monitoring Home BP Monitoring not checking Chest pain- no    Dyspnea- no Medications Compliance-  taking valsartan.  Edema- no BMET    Component Value Date/Time   NA 135 08/06/2021 0820   K 3.9 08/06/2021 0820   CL 105 08/06/2021 0820   CO2 24 08/06/2021 0820   GLUCOSE 102 (H) 08/06/2021 0820   BUN 18 08/06/2021 0820   CREATININE 0.88 08/06/2021 0820   CREATININE 0.90 09/10/2018 1527   CALCIUM 9.7 08/06/2021 0820   GERD:   Reflux symptoms: not while taking medication, though has symptoms if she misses a dose of omeprazole   Abd pain: no   Blood in stool: no  Dysphagia: no   EGD: no  Medication: omeprazole  She quit smoking.    Social History   Tobacco Use  Smoking Status Former   Types: Cigarettes  Smokeless Tobacco Never    Current Outpatient Medications on File Prior to Visit  Medication Sig Dispense Refill   Cholecalciferol (VITAMIN D3) 75 MCG (3000 UT) TABS Take by mouth.     cyclobenzaprine (FLEXERIL) 10 MG tablet Take 1 tablet (10 mg total) by mouth 3 (three) times daily as needed for muscle spasms. 30 tablet 0   fluticasone (FLONASE) 50 MCG/ACT nasal spray Place 2 sprays into both nostrils daily. 16 g 6   loratadine (CLARITIN) 10 MG tablet Take 1 tablet (10 mg total) by mouth daily. 30 tablet 11   omeprazole (PRILOSEC) 40 MG capsule TAKE ONE CAPSULE BY MOUTH DAILY 90 capsule 1   triamcinolone cream (KENALOG) 0.1 % Apply 1 application topically 2 (two) times daily. 30 g 0   valsartan (DIOVAN) 160 MG tablet Take 1 tablet (160 mg total) by mouth daily. 90 tablet 3   No current facility-administered medications on file prior to visit.     ROS see history of present illness  Objective  Physical Exam Vitals:   02/05/22 0813  BP: 125/80  Pulse: 78  Temp: 98.5 F (36.9 C)  SpO2: 97%    BP Readings from Last 3 Encounters:   02/05/22 125/80  01/06/22 117/84  08/06/21 130/80   Wt Readings from Last 3 Encounters:  02/05/22 220 lb 6.4 oz (100 kg)  08/06/21 222 lb 6.4 oz (100.9 kg)  06/05/21 220 lb 6.4 oz (100 kg)    Physical Exam Constitutional:      General: She is not in acute distress.    Appearance: She is not diaphoretic.  Cardiovascular:     Rate and Rhythm: Normal rate and regular rhythm.     Heart sounds: Normal heart sounds.  Pulmonary:     Effort: Pulmonary effort is normal.     Breath sounds: Normal breath sounds.  Musculoskeletal:     Right lower leg: No edema.     Left lower leg: No edema.  Skin:    General: Skin is warm and dry.  Neurological:     Mental Status: She is alert.     Assessment/Plan: Please see individual problem list.  Problem List Items Addressed This Visit     Benign essential HTN (Chronic)    Well controlled. I will continue her valsartan 160 mg daily. Check labs.        Relevant Orders   Comp Met (CMET)   Lipid panel   HgB A1c   GERD (gastroesophageal reflux disease) (Chronic)  Well controlled. She will continue omeprazole 40 mg once daily. Refer to GI to consider EGD given that she has had reflux for around 20 years.       Relevant Orders   Ambulatory referral to Gastroenterology   Former smoker    Congratulated on smoking cessation.        Health maintenance: Patient notes she is not interested in further COVID vaccines.  Return in about 6 months (around 08/07/2022) for HTN.   Tommi Rumps, MD [place]

## 2022-02-05 NOTE — Patient Instructions (Signed)
Nice to see you. GI should contact you to set up an appointment. If you do not hear from them in the next week please let us know.  We will contact you with your lab results.

## 2022-02-05 NOTE — Assessment & Plan Note (Signed)
Well controlled. She will continue omeprazole 40 mg once daily. Refer to GI to consider EGD given that she has had reflux for around 20 years.

## 2022-02-05 NOTE — Assessment & Plan Note (Signed)
Well controlled. I will continue her valsartan 160 mg daily. Check labs.

## 2022-03-10 LAB — HM MAMMOGRAPHY

## 2022-05-31 ENCOUNTER — Other Ambulatory Visit: Payer: Self-pay | Admitting: Family Medicine

## 2022-06-04 ENCOUNTER — Ambulatory Visit (INDEPENDENT_AMBULATORY_CARE_PROVIDER_SITE_OTHER): Payer: Commercial Managed Care - PPO | Admitting: *Deleted

## 2022-06-04 DIAGNOSIS — Z23 Encounter for immunization: Secondary | ICD-10-CM

## 2022-07-08 ENCOUNTER — Other Ambulatory Visit: Payer: Self-pay | Admitting: Family Medicine

## 2022-07-08 DIAGNOSIS — K219 Gastro-esophageal reflux disease without esophagitis: Secondary | ICD-10-CM

## 2022-08-06 ENCOUNTER — Ambulatory Visit: Payer: Commercial Managed Care - PPO | Admitting: Family Medicine

## 2022-09-15 ENCOUNTER — Ambulatory Visit (INDEPENDENT_AMBULATORY_CARE_PROVIDER_SITE_OTHER): Payer: BC Managed Care – PPO | Admitting: Family Medicine

## 2022-09-15 ENCOUNTER — Encounter: Payer: Self-pay | Admitting: Family Medicine

## 2022-09-15 VITALS — BP 120/80 | HR 88 | Temp 99.2°F | Ht 67.0 in | Wt 227.6 lb

## 2022-09-15 DIAGNOSIS — J309 Allergic rhinitis, unspecified: Secondary | ICD-10-CM | POA: Diagnosis not present

## 2022-09-15 DIAGNOSIS — R7303 Prediabetes: Secondary | ICD-10-CM | POA: Diagnosis not present

## 2022-09-15 DIAGNOSIS — I10 Essential (primary) hypertension: Secondary | ICD-10-CM

## 2022-09-15 DIAGNOSIS — K219 Gastro-esophageal reflux disease without esophagitis: Secondary | ICD-10-CM

## 2022-09-15 LAB — BASIC METABOLIC PANEL
BUN: 13 mg/dL (ref 6–23)
CO2: 26 mEq/L (ref 19–32)
Calcium: 8.8 mg/dL (ref 8.4–10.5)
Chloride: 105 mEq/L (ref 96–112)
Creatinine, Ser: 0.86 mg/dL (ref 0.40–1.20)
GFR: 81.19 mL/min (ref >60.00)
Glucose, Bld: 105 mg/dL — ABNORMAL HIGH (ref 70–99)
Potassium: 4.2 mEq/L (ref 3.5–5.1)
Sodium: 140 mEq/L (ref 135–145)

## 2022-09-15 LAB — HEMOGLOBIN A1C: Hgb A1c MFr Bld: 5.8 % (ref 4.6–6.5)

## 2022-09-15 MED ORDER — OMEPRAZOLE 40 MG PO CPDR: 40.0000 mg | DELAYED_RELEASE_CAPSULE | Freq: Every day | ORAL | 1 refills | Status: DC

## 2022-09-15 MED ORDER — LORATADINE 10 MG PO TABS: 10.0000 mg | ORAL_TABLET | Freq: Every day | ORAL | 11 refills | Status: AC

## 2022-09-15 MED ORDER — FLUTICASONE PROPIONATE 50 MCG/ACT NA SUSP: 2.0000 | Freq: Every day | NASAL | 6 refills | Status: DC

## 2022-09-15 NOTE — Progress Notes (Signed)
Tommi Rumps, MD Phone: [phone]  Amy Day is a 47 y.o. female who presents today for f/u.  HYPERTENSION Disease Monitoring Home BP Monitoring 120s/80-83 Chest pain- no    Dyspnea- no Medications Compliance-  taking valsartan.  Edema- no BMET    Component Value Date/Time   NA 139 02/05/2022 0820   K 4.3 02/05/2022 0820   CL 104 02/05/2022 0820   CO2 25 02/05/2022 0820   GLUCOSE 100 (H) 02/05/2022 0820   BUN 17 02/05/2022 0820   CREATININE 0.82 02/05/2022 0820   CREATININE 0.90 09/10/2018 1527   CALCIUM 9.8 02/05/2022 0820   GERD: Patient has seen GI and they plan on doing an EGD.  She continues on omeprazole 40 mg daily.  She notes no reflux symptoms.  Throat clearing: Patient notes constantly feeling as though she has to clear her throat throughout the day.  She does note postnasal drip lots of mornings.  Some sneezing.  No reflux symptoms.  She is a former smoker.  Social History   Tobacco Use  Smoking Status Former   Types: Cigarettes  Smokeless Tobacco Never    Current Outpatient Medications on File Prior to Visit  Medication Sig Dispense Refill   Cholecalciferol (VITAMIN D3) 75 MCG (3000 UT) TABS Take by mouth.     cyclobenzaprine (FLEXERIL) 10 MG tablet Take 1 tablet (10 mg total) by mouth 3 (three) times daily as needed for muscle spasms. 30 tablet 0   triamcinolone cream (KENALOG) 0.1 % Apply 1 application topically 2 (two) times daily. 30 g 0   valsartan (DIOVAN) 160 MG tablet TAKE 1 TABLET(160 MG) BY MOUTH DAILY 90 tablet 3   No current facility-administered medications on file prior to visit.     ROS see history of present illness  Objective  Physical Exam Vitals:   09/15/22 0932  BP: 120/80  Pulse: 88  Temp: 99.2 F (37.3 C)  SpO2: 99%    BP Readings from Last 3 Encounters:  09/15/22 120/80  02/05/22 125/80  01/06/22 117/84   Wt Readings from Last 3 Encounters:  09/15/22 227 lb 9.6 oz (103.2 kg)  02/05/22 220 lb 6.4 oz (100  kg)  08/06/21 222 lb 6.4 oz (100.9 kg)    Physical Exam Constitutional:      General: She is not in acute distress.    Appearance: She is not diaphoretic.  HENT:     Mouth/Throat:     Mouth: Mucous membranes are moist.     Pharynx: Oropharynx is clear.  Cardiovascular:     Rate and Rhythm: Normal rate and regular rhythm.     Heart sounds: Normal heart sounds.  Pulmonary:     Effort: Pulmonary effort is normal.     Breath sounds: Normal breath sounds.  Skin:    General: Skin is warm and dry.  Neurological:     Mental Status: She is alert.      Assessment/Plan: Please see individual problem list.  Benign essential HTN Assessment & Plan: Chronic issue.  Well-controlled.  She will continue valsartan 160 mg daily.  Check BMP.  Orders: -     Basic metabolic panel  Gastroesophageal reflux disease, unspecified whether esophagitis present Assessment & Plan: Chronic issue.  Well-controlled.  She will continue omeprazole 40 mg daily.  Orders: -     Omeprazole; Take 1 capsule (40 mg total) by mouth daily.  Dispense: 90 capsule; Refill: 1  Allergic rhinitis, unspecified seasonality, unspecified trigger Assessment & Plan: I suspect her throat  symptoms are likely related to allergies.  I encouraged her to restart Flonase and Claritin.  If this is not improving over the next 2 weeks she will let me know and she may need to see ENT to have direct visualization of her throat.  Orders: -     Fluticasone Propionate; Place 2 sprays into both nostrils daily.  Dispense: 16 g; Refill: 6 -     Loratadine; Take 1 tablet (10 mg total) by mouth daily.  Dispense: 30 tablet; Refill: 11  Prediabetes Assessment & Plan: Check A1c.  Orders: -     Hemoglobin A1c    Return in about 6 months (around 03/16/2023) for physical.   Tommi Rumps, MD [place]

## 2022-09-15 NOTE — Assessment & Plan Note (Signed)
Check A1c 

## 2022-09-15 NOTE — Assessment & Plan Note (Signed)
I suspect her throat symptoms are likely related to allergies.  I encouraged her to restart Flonase and Claritin.  If this is not improving over the next 2 weeks she will let me know and she may need to see ENT to have direct visualization of her throat.

## 2022-09-15 NOTE — Patient Instructions (Signed)
Nice to see you. Please restart Flonase and Claritin and see if that helps with your throat symptoms.  If it does not help within 2 weeks please let me know.

## 2022-09-15 NOTE — Assessment & Plan Note (Signed)
Chronic issue.  Well-controlled.  She will continue valsartan 160 mg daily.  Check BMP.

## 2022-09-15 NOTE — Assessment & Plan Note (Signed)
Chronic issue.  Well-controlled.  She will continue omeprazole 40 mg daily.

## 2022-11-20 LAB — HM COLONOSCOPY

## 2022-12-22 ENCOUNTER — Other Ambulatory Visit: Payer: Self-pay | Admitting: Family Medicine

## 2022-12-22 DIAGNOSIS — K219 Gastro-esophageal reflux disease without esophagitis: Secondary | ICD-10-CM

## 2023-01-06 ENCOUNTER — Ambulatory Visit (INDEPENDENT_AMBULATORY_CARE_PROVIDER_SITE_OTHER): Payer: BC Managed Care – PPO | Admitting: Obstetrics and Gynecology

## 2023-01-06 ENCOUNTER — Encounter: Payer: Self-pay | Admitting: Obstetrics and Gynecology

## 2023-01-06 VITALS — BP 124/80 | Ht 67.0 in | Wt 227.0 lb

## 2023-01-06 DIAGNOSIS — Z1231 Encounter for screening mammogram for malignant neoplasm of breast: Secondary | ICD-10-CM

## 2023-01-06 DIAGNOSIS — Z01419 Encounter for gynecological examination (general) (routine) without abnormal findings: Secondary | ICD-10-CM

## 2023-01-06 NOTE — Patient Instructions (Signed)
I value your feedback and you entrusting us with your care. If you get a [facility] patient survey, I would appreciate you taking the time to let us know about your experience today. Thank you! ? ? ?

## 2023-01-06 NOTE — Progress Notes (Signed)
Chief Complaint  Patient presents with   Gynecologic Exam    No concerns     HPI:      [name] is a 47 y.o. [id number] who LMP was No LMP recorded. Patient has had a hysterectomy., presents today for her annual examination. Her menses are absent due to lap hyst/bilat salpingectomy for RLQ pain 2/19. Dysmenorrhea none. She does not have PMB. Few vasomotor sx.    Sex activity: single partner, contraception - tubal ligation/hyst. Has a little vaginal dryness, improved with lubricant/coconut oil.  Last Pap: 11/30/20  Results were: no abnormalities /neg HPV DNA    Last mammogram: 03/10/22 at [facility]; Results were normal; 1/22 bx showed PASH LT breast  There is a FH of breast cancer in her PGM, genetic testing not indicated. There is no FH of ovarian cancer. The patient does do self-breast exams.   Tobacco use: quit Alcohol use: daily No drug use Exercise: min active  Colonoscopy: 3/24 at [facility] due to FH of colon cancer/hx of polyp; 2018 with polyp; repeat due after 3 yrs  She does get adequate calcium and Vitamin D in her diet.   She has labs with her PCP.    Past Medical History:  Diagnosis Date   Anxiety    GERD (gastroesophageal reflux disease)    Hypertension     Past Surgical History:  Procedure Laterality Date   ABDOMINAL HYSTERECTOMY     RLQ pain; at [facility]   ABLATION  2013   BREAST REDUCTION SURGERY     BREAST SURGERY  2012   Breast Reduction   COLONOSCOPY WITH PROPOFOL N/A 06/25/2017   Procedure: COLONOSCOPY WITH PROPOFOL;  Surgeon: Toney Reil, MD;  Location: [facility] ENDOSCOPY;  Service: Gastroenterology;  Laterality: N/A;   REDUCTION MAMMAPLASTY     followed by breast lift   TUBAL LIGATION  2006    Family History  Problem Relation Age of Onset   Diabetes Father    Heart attack Father    Colon cancer Mother    COPD Paternal Grandmother    Breast cancer Paternal Grandmother 75    Social History   Socioeconomic History   Marital status: Married     Spouse name: Not on file   Number of children: Not on file   Years of education: Not on file   Highest education level: Not on file  Occupational History   Not on file  Tobacco Use   Smoking status: Former    Types: Cigarettes   Smokeless tobacco: Never  Vaping Use   Vaping Use: Former  Substance and Sexual Activity   Alcohol use: Yes    Alcohol/week: 0.0 standard drinks of alcohol    Comment: Rare    Drug use: No   Sexual activity: Yes    Partners: Male    Birth control/protection: Surgical    Comment: Hysterectomy  Other Topics Concern   Not on file  Social History Narrative   Married    Employed at [employer]    Some college- highest level    Caffeine- Coffee, tea, and occasional soda    Social Determinants of Health   Financial Resource Strain: Not on file  Food Insecurity: Not on file  Transportation Needs: Not on file  Physical Activity: Not on file  Stress: Not on file  Social Connections: Not on file  Intimate Partner Violence: Not on file    Current Outpatient Medications on File Prior to Visit  Medication  Sig Dispense Refill   Cholecalciferol (VITAMIN D3) 75 MCG (3000 UT) TABS Take by mouth.     cyclobenzaprine (FLEXERIL) 10 MG tablet Take 1 tablet (10 mg total) by mouth 3 (three) times daily as needed for muscle spasms. 30 tablet 0   fluticasone (FLONASE) 50 MCG/ACT nasal spray Place 2 sprays into both nostrils daily. 16 g 6   loratadine (CLARITIN) 10 MG tablet Take 1 tablet (10 mg total) by mouth daily. 30 tablet 11   omeprazole (PRILOSEC) 40 MG capsule TAKE 1 CAPSULE BY MOUTH DAILY 90 capsule 1   triamcinolone cream (KENALOG) 0.1 % Apply 1 application topically 2 (two) times daily. 30 g 0   valsartan (DIOVAN) 160 MG tablet TAKE 1 TABLET(160 MG) BY MOUTH DAILY 90 tablet 3   No current facility-administered medications on file prior to visit.      ROS:  Review of Systems  Constitutional:  Negative for fatigue, fever and  unexpected weight change.  Respiratory:  Negative for cough, shortness of breath and wheezing.   Cardiovascular:  Negative for chest pain, palpitations and leg swelling.  Gastrointestinal:  Negative for blood in stool, constipation, diarrhea, nausea and vomiting.  Endocrine: Negative for cold intolerance, heat intolerance and polyuria.  Genitourinary:  Negative for dyspareunia, dysuria, flank pain, frequency, genital sores, hematuria, menstrual problem, pelvic pain, urgency, vaginal bleeding, vaginal discharge and vaginal pain.  Musculoskeletal:  Negative for back pain, joint swelling and myalgias.  Skin:  Negative for rash.  Neurological:  Negative for dizziness, syncope, light-headedness, numbness and headaches.  Hematological:  Negative for adenopathy.  Psychiatric/Behavioral:  Negative for agitation, confusion, sleep disturbance and suicidal ideas. The patient is not nervous/anxious.      Objective: BP 124/80   Ht 5\' 7"  (1.702 m)   Wt 227 lb (103 kg)   BMI 35.55 kg/m    Physical Exam Constitutional:      Appearance: She is well-developed.  Genitourinary:     Vulva normal.     Genitourinary Comments: UTERUS/CX SURG REM     Right Labia: No rash, tenderness or lesions.    Left Labia: No tenderness, lesions or rash.    Vaginal cuff intact.    No vaginal discharge, erythema or tenderness.      Right Adnexa: not tender and no mass present.    Left Adnexa: not tender and no mass present.    Cervix is absent.     Uterus is absent.  Breasts:    Right: No mass, nipple discharge, skin change or tenderness.     Left: No mass, nipple discharge, skin change or tenderness.  Neck:     Thyroid: No thyromegaly.  Cardiovascular:     Rate and Rhythm: Normal rate and regular rhythm.     Heart sounds: Normal heart sounds. No murmur heard. Pulmonary:     Effort: Pulmonary effort is normal.     Breath sounds: Normal breath sounds.  Abdominal:     Palpations: Abdomen is soft.      Tenderness: There is no abdominal tenderness. There is no guarding.  Musculoskeletal:        General: Normal range of motion.     Cervical back: Normal range of motion.  Neurological:     General: No focal deficit present.     Mental Status: She is alert and oriented to person, place, and time.     Cranial Nerves: No cranial nerve deficit.  Skin:    General: Skin is warm and dry.  Psychiatric:  Mood and Affect: Mood normal.        Behavior: Behavior normal.        Thought Content: Thought content normal.        Judgment: Judgment normal.  Vitals reviewed.     Assessment/Plan: Encounter for annual routine gynecological examination  Encounter for screening mammogram for malignant neoplasm of breast - Plan: MM 3D SCREENING MAMMOGRAM BILATERAL BREAST; pt to schedule mammo       GYN counsel breast self exam, mammography screening, adequate intake of calcium and vitamin D, diet and exercise, tob cessation     F/U  Return in about 1 year (around 01/06/2024).  Elsi Stelzer B. Dawayne Ohair, PA-C 01/06/2023 2:29 PM

## 2023-01-07 ENCOUNTER — Encounter: Payer: Self-pay | Admitting: Family Medicine

## 2023-01-07 ENCOUNTER — Ambulatory Visit (INDEPENDENT_AMBULATORY_CARE_PROVIDER_SITE_OTHER): Payer: BC Managed Care – PPO | Admitting: Family Medicine

## 2023-01-07 VITALS — BP 130/82 | HR 73 | Temp 98.2°F | Ht 67.0 in | Wt 227.4 lb

## 2023-01-07 DIAGNOSIS — R600 Localized edema: Secondary | ICD-10-CM

## 2023-01-07 DIAGNOSIS — I10 Essential (primary) hypertension: Secondary | ICD-10-CM

## 2023-01-07 HISTORY — DX: Localized edema: R60.0

## 2023-01-07 LAB — COMPREHENSIVE METABOLIC PANEL
ALT: 19 U/L (ref 0–35)
AST: 15 U/L (ref 0–37)
Albumin: 4.1 g/dL (ref 3.5–5.2)
Alkaline Phosphatase: 81 U/L (ref 39–117)
BUN: 11 mg/dL (ref 6–23)
CO2: 23 mEq/L (ref 19–32)
Calcium: 9.3 mg/dL (ref 8.4–10.5)
Chloride: 106 mEq/L (ref 96–112)
Creatinine, Ser: 0.74 mg/dL (ref 0.40–1.20)
GFR: 97.03 mL/min (ref >60.00)
Glucose, Bld: 96 mg/dL (ref 70–99)
Potassium: 4.5 mEq/L (ref 3.5–5.1)
Sodium: 140 mEq/L (ref 135–145)
Total Bilirubin: 0.6 mg/dL (ref 0.2–1.2)
Total Protein: 6.8 g/dL (ref 6.0–8.3)

## 2023-01-07 LAB — CBC
HCT: 42.4 % (ref 36.0–46.0)
Hemoglobin: 14.3 g/dL (ref 12.0–15.0)
MCHC: 33.6 g/dL (ref 30.0–36.0)
MCV: 95.7 fl (ref 78.0–100.0)
Platelets: 308 10*3/uL (ref 150.0–400.0)
RBC: 4.43 Mil/uL (ref 3.87–5.11)
RDW: 12.9 % (ref 11.5–15.5)
WBC: 7.6 10*3/uL (ref 4.0–10.5)

## 2023-01-07 LAB — TSH: TSH: 2.01 u[IU]/mL (ref 0.35–5.50)

## 2023-01-07 NOTE — Assessment & Plan Note (Signed)
Chronic issue.  Adequately controlled.  She will continue valsartan 160 mg daily.

## 2023-01-07 NOTE — Assessment & Plan Note (Signed)
Undetermined cause.  Discussed this could be related to venous insufficiency or some other underlying cause.  Unlikely cardiac related given lack of other cardiac symptoms.  Will check lab work to evaluate for underlying causes.

## 2023-01-07 NOTE — Progress Notes (Signed)
Marikay Alar, MD Phone: [phone]  Amy Day is a 47 y.o. female who presents today for same day visit.   Lower extremity swelling: This has been going on about a month.  She notes no injury to contribute to this.  She notes no shortness of breath, orthopnea, or PND.  She notes it does improve overnight that does not completely resolve.  She is cut out added salt which did not help.  She does prop her legs up and that is not terribly beneficial.  She notes no new medications.  She did add psyllium husk recently.  Notes blood pressure has been typically around 120/80.  Social History   Tobacco Use  Smoking Status Former   Types: Cigarettes  Smokeless Tobacco Never    Current Outpatient Medications on File Prior to Visit  Medication Sig Dispense Refill   Cholecalciferol (VITAMIN D3) 75 MCG (3000 UT) TABS Take by mouth.     cyclobenzaprine (FLEXERIL) 10 MG tablet Take 1 tablet (10 mg total) by mouth 3 (three) times daily as needed for muscle spasms. 30 tablet 0   fluticasone (FLONASE) 50 MCG/ACT nasal spray Place 2 sprays into both nostrils daily. 16 g 6   loratadine (CLARITIN) 10 MG tablet Take 1 tablet (10 mg total) by mouth daily. 30 tablet 11   omeprazole (PRILOSEC) 40 MG capsule TAKE 1 CAPSULE BY MOUTH DAILY 90 capsule 1   triamcinolone cream (KENALOG) 0.1 % Apply 1 application topically 2 (two) times daily. 30 g 0   valsartan (DIOVAN) 160 MG tablet TAKE 1 TABLET(160 MG) BY MOUTH DAILY 90 tablet 3   No current facility-administered medications on file prior to visit.     ROS see history of present illness  Objective  Physical Exam Vitals:   01/07/23 0911 01/07/23 0915  BP: 134/84 130/82  Pulse: 73   Temp: 98.2 F (36.8 C)   SpO2: 99%     BP Readings from Last 3 Encounters:  01/07/23 130/82  01/06/23 124/80  09/15/22 120/80   Wt Readings from Last 3 Encounters:  01/07/23 227 lb 6.4 oz (103.1 kg)  01/06/23 227 lb (103 kg)  09/15/22 227 lb 9.6 oz (103.2  kg)    Physical Exam Constitutional:      General: She is not in acute distress.    Appearance: She is not diaphoretic.  Cardiovascular:     Rate and Rhythm: Normal rate and regular rhythm.     Heart sounds: Normal heart sounds.  Pulmonary:     Effort: Pulmonary effort is normal.     Breath sounds: Normal breath sounds.  Musculoskeletal:     Comments: 1+ pitting edema bilateral lower extremities to the midshin  Skin:    General: Skin is warm and dry.  Neurological:     Mental Status: She is alert.      Assessment/Plan: Please see individual problem list.  Bilateral lower extremity edema Assessment & Plan: Undetermined cause.  Discussed this could be related to venous insufficiency or some other underlying cause.  Unlikely cardiac related given lack of other cardiac symptoms.  Will check lab work to evaluate for underlying causes.  Orders: -     Comprehensive metabolic panel -     CBC -     TSH  Benign essential HTN Assessment & Plan: Chronic issue.  Adequately controlled.  She will continue valsartan 160 mg daily.      Return in about 6 months (around 07/10/2023) for htn.   Marikay Alar, MD  [place]

## 2023-05-22 ENCOUNTER — Other Ambulatory Visit: Payer: Self-pay | Admitting: Family Medicine

## 2023-05-26 ENCOUNTER — Encounter: Payer: Self-pay | Admitting: Family Medicine

## 2023-05-26 ENCOUNTER — Other Ambulatory Visit: Payer: Self-pay

## 2023-05-26 MED ORDER — VALSARTAN 160 MG PO TABS: 160.0000 mg | ORAL_TABLET | Freq: Every day | ORAL | 3 refills | Status: DC

## 2023-06-01 ENCOUNTER — Ambulatory Visit: Payer: BC Managed Care – PPO

## 2023-06-02 ENCOUNTER — Ambulatory Visit (INDEPENDENT_AMBULATORY_CARE_PROVIDER_SITE_OTHER): Payer: BC Managed Care – PPO

## 2023-06-02 DIAGNOSIS — Z23 Encounter for immunization: Secondary | ICD-10-CM

## 2023-06-02 NOTE — Progress Notes (Cosign Needed)
Given flu vaccine 

## 2023-06-18 ENCOUNTER — Other Ambulatory Visit: Payer: Self-pay | Admitting: Family Medicine

## 2023-06-18 DIAGNOSIS — K219 Gastro-esophageal reflux disease without esophagitis: Secondary | ICD-10-CM

## 2023-07-23 ENCOUNTER — Encounter: Payer: Self-pay | Admitting: Obstetrics and Gynecology

## 2023-07-23 LAB — HM MAMMOGRAPHY

## 2023-08-04 ENCOUNTER — Encounter: Payer: Self-pay | Admitting: Family Medicine

## 2023-11-13 ENCOUNTER — Telehealth: Payer: Self-pay | Admitting: Family Medicine

## 2023-11-13 NOTE — Telephone Encounter (Signed)
 Dr Birdie Sons is no longer at this location. Please call the office to schedule a Transfer of Care to either Dr Charlann Lange, Darleen Crocker or Kara Dies, NP. Purvis Sheffield, please schedule a TOC visit for this patient. [facility]   Thank you

## 2023-12-14 ENCOUNTER — Telehealth: Payer: Self-pay | Admitting: Pharmacy Technician

## 2023-12-14 ENCOUNTER — Other Ambulatory Visit: Payer: Self-pay | Admitting: Internal Medicine

## 2023-12-14 ENCOUNTER — Other Ambulatory Visit (HOSPITAL_COMMUNITY): Payer: Self-pay

## 2023-12-14 ENCOUNTER — Other Ambulatory Visit: Payer: Self-pay

## 2023-12-14 DIAGNOSIS — K219 Gastro-esophageal reflux disease without esophagitis: Secondary | ICD-10-CM

## 2023-12-14 MED ORDER — OMEPRAZOLE 40 MG PO CPDR: 40.0000 mg | DELAYED_RELEASE_CAPSULE | Freq: Every day | ORAL | 0 refills | Status: DC

## 2023-12-14 MED ORDER — PANTOPRAZOLE SODIUM 40 MG PO TBEC: 40.0000 mg | DELAYED_RELEASE_TABLET | Freq: Every day | ORAL | 0 refills | Status: DC

## 2023-12-14 NOTE — Telephone Encounter (Signed)
 Spoke with pt and she stated that she gets this medication filled at [facility] and uses the Goodrx card. Pt asked that we send the rx there. I have sent the medication to [facility] and discontinued the Pantoprazole.

## 2023-12-14 NOTE — Addendum Note (Signed)
 Addended by: Karthikeya Funke on: 12/14/2023 03:44 PM   Modules accepted: Orders

## 2023-12-14 NOTE — Telephone Encounter (Signed)
 Pharmacy Patient Advocate Encounter   Received notification from CoverMyMeds that prior authorization for Omeprazole 40MG  dr capsules is required/requested.   Insurance verification completed.   The patient is insured through [facility] .   Per test claim: PA required; PA submitted to above mentioned insurance via CoverMyMeds Key/confirmation #/EOC [id number] Status is pending

## 2023-12-14 NOTE — Telephone Encounter (Signed)
 Pharmacy Patient Advocate Encounter  Received notification from  [facility]   that Prior Authorization for OMEPRAZOLE 40MG   has been DENIED.  Full denial letter will be uploaded to the media tab. See denial reason below.  PA #/Case ID/Reference #: [id number]

## 2023-12-14 NOTE — Telephone Encounter (Signed)
 noted

## 2024-01-22 ENCOUNTER — Ambulatory Visit (INDEPENDENT_AMBULATORY_CARE_PROVIDER_SITE_OTHER)

## 2024-01-22 VITALS — BP 108/74 | HR 82 | Temp 98.5°F | Ht 67.0 in | Wt 225.2 lb

## 2024-01-22 DIAGNOSIS — J301 Allergic rhinitis due to pollen: Secondary | ICD-10-CM

## 2024-01-22 DIAGNOSIS — R600 Localized edema: Secondary | ICD-10-CM | POA: Diagnosis not present

## 2024-01-22 DIAGNOSIS — F411 Generalized anxiety disorder: Secondary | ICD-10-CM | POA: Diagnosis not present

## 2024-01-22 DIAGNOSIS — K449 Diaphragmatic hernia without obstruction or gangrene: Secondary | ICD-10-CM | POA: Insufficient documentation

## 2024-01-22 DIAGNOSIS — K219 Gastro-esophageal reflux disease without esophagitis: Secondary | ICD-10-CM

## 2024-01-22 DIAGNOSIS — R7303 Prediabetes: Secondary | ICD-10-CM

## 2024-01-22 DIAGNOSIS — E66812 Obesity, class 2: Secondary | ICD-10-CM

## 2024-01-22 DIAGNOSIS — I10 Essential (primary) hypertension: Secondary | ICD-10-CM

## 2024-01-22 DIAGNOSIS — F321 Major depressive disorder, single episode, moderate: Secondary | ICD-10-CM | POA: Insufficient documentation

## 2024-01-22 DIAGNOSIS — Z79899 Other long term (current) drug therapy: Secondary | ICD-10-CM

## 2024-01-22 LAB — LIPID PANEL
Cholesterol: 233 mg/dL — ABNORMAL HIGH (ref 0–200)
HDL: 67.6 mg/dL (ref >39.00)
LDL Cholesterol: 141 mg/dL — ABNORMAL HIGH (ref 0–99)
NonHDL: 165.62
Total CHOL/HDL Ratio: 3
Triglycerides: 125 mg/dL (ref 0.0–149.0)
VLDL: 25 mg/dL (ref 0.0–40.0)

## 2024-01-22 LAB — COMPREHENSIVE METABOLIC PANEL WITH GFR
ALT: 16 U/L (ref 0–35)
AST: 12 U/L (ref 0–37)
Albumin: 4.6 g/dL (ref 3.5–5.2)
Alkaline Phosphatase: 89 U/L (ref 39–117)
BUN: 14 mg/dL (ref 6–23)
CO2: 27 meq/L (ref 19–32)
Calcium: 9.6 mg/dL (ref 8.4–10.5)
Chloride: 102 meq/L (ref 96–112)
Creatinine, Ser: 0.93 mg/dL (ref 0.40–1.20)
GFR: 73.22 mL/min (ref >60.00)
Glucose, Bld: 104 mg/dL — ABNORMAL HIGH (ref 70–99)
Potassium: 4.5 meq/L (ref 3.5–5.1)
Sodium: 138 meq/L (ref 135–145)
Total Bilirubin: 0.5 mg/dL (ref 0.2–1.2)
Total Protein: 7.4 g/dL (ref 6.0–8.3)

## 2024-01-22 LAB — CBC
HCT: 46.9 % — ABNORMAL HIGH (ref 36.0–46.0)
Hemoglobin: 15.8 g/dL — ABNORMAL HIGH (ref 12.0–15.0)
MCHC: 33.7 g/dL (ref 30.0–36.0)
MCV: 93.3 fl (ref 78.0–100.0)
Platelets: 332 10*3/uL (ref 150.0–400.0)
RBC: 5.03 Mil/uL (ref 3.87–5.11)
RDW: 12.7 % (ref 11.5–15.5)
WBC: 9.2 10*3/uL (ref 4.0–10.5)

## 2024-01-22 LAB — HEMOGLOBIN A1C: Hgb A1c MFr Bld: 5.7 % (ref 4.6–6.5)

## 2024-01-22 LAB — VITAMIN D 25 HYDROXY (VIT D DEFICIENCY, FRACTURES): VITD: 37.23 ng/mL (ref 30.00–100.00)

## 2024-01-22 LAB — VITAMIN B12: Vitamin B-12: 276 pg/mL (ref 211–911)

## 2024-01-22 LAB — TSH: TSH: 1.85 u[IU]/mL (ref 0.35–5.50)

## 2024-01-22 MED ORDER — VALSARTAN 160 MG PO TABS: 160.0000 mg | ORAL_TABLET | Freq: Every day | ORAL | 3 refills | Status: AC

## 2024-01-22 MED ORDER — HYDROXYZINE PAMOATE 25 MG PO CAPS: 25.0000 mg | ORAL_CAPSULE | Freq: Every day | ORAL | 0 refills | Status: DC | PRN

## 2024-01-22 NOTE — Assessment & Plan Note (Signed)
 Reviewed PHQ-9. No SI/HI D/D includes perimenopausal mood change, thyroid  disorder, vitamin d  deficiency, anemia. Check labs. Discussed treatment with HRT, SSRI/SNRI, counseling.  Start conservative management first, f/u in 3 months.  Patient will f/u ob/gyn if conservative management does not help to discuss HRT.

## 2024-01-22 NOTE — Assessment & Plan Note (Signed)
 Noted on EGD on 11/20/22, symptoms stable on Omeprazole  40 mg daily. Continue.

## 2024-01-22 NOTE — Assessment & Plan Note (Signed)
 Stable on prn nasal and Flonase  and Claritin . COntinue

## 2024-01-22 NOTE — Assessment & Plan Note (Signed)
 Chronic issue. Last EGD  11/20/2022 small hiatal hernia. She will continue omeprazole  40 mg daily. Check vitamin B 12 level today.

## 2024-01-22 NOTE — Patient Instructions (Addendum)
--   Increase in moderate intensity exercise for 30 min each day.  -- Take Hydroxyzine 25 mg only as needed at bedtime when you can't sleep. Be aware that this can make you feel groggy in the morning.  -- Take daily multivitamin.  -- Take vitamin D  1,000 units daily. You can get this over the counter.

## 2024-01-22 NOTE — Progress Notes (Signed)
 Established Patient Office Visit   Subjective  Patient ID: Amy Day, female    DOB: 08-04-1976  Age: 48 y.o. MRN: [id number]  Chief Complaint  Patient presents with   Transitions Of Care    She  has a past medical history of Anxiety, Bilateral lower extremity edema (01/07/2023), Cervical radiculopathy (10/03/2019), Family history of colon cancer in mother (07/09/2020), Former smoker (09/10/2018), GERD (gastroesophageal reflux disease), Hypertension, and Skin lesion (12/26/2020).  HPI 1) GERD: Has been on Prilosec for about 27 years. She takes 40 mg once a day. If she forgets taking medication she starts getting bitter taste in her mouth. EGD on 11/20/2022 small hiatal hernia.   2) Mixed hyperlipidemia: - Exercise: Walks dog for about 20 minutes each day.  - Diet: Eating more fish, white meat, mostly eats healthy.   3) Mood change:  - Anxiety and depression, ongoing for about 8-9 months ago.  - No thoughts of SI/HI.  - Has not tried counseling yet.  - Wakes up sometimes at night and can't shut her brain from racing thoughts.  - Has noted night sweats, for about the last year. Goes to Ob/gyn for annual ob/gyn physical.  - LMP about 10 years ago.   4) Essential hypertension:  - On Valsartan  160 mg, tolerating, no s/e, new concerns.   5) Seasonal allergy:  - Takes prn nasal Flonase , Claritin  with adequate symptoms control.   ROS As per HPI    Objective:      BP 108/74   Pulse 82   Temp 98.5 F (36.9 C) (Oral)   Ht 5\' 7"  (1.702 m)   Wt 225 lb 3.2 oz (102.2 kg)   SpO2 97%   BMI 35.27 kg/m      01/22/2024   10:15 AM 01/07/2023    9:12 AM 09/15/2022    9:37 AM  Depression screen PHQ 2/9  Decreased Interest 1 0 0  Down, Depressed, Hopeless 2 0 0  PHQ - 2 Score 3 0 0  Altered sleeping 2 0   Tired, decreased energy 1 0   Change in appetite 0 0   Feeling bad or failure about yourself  2 0   Trouble concentrating 2 0   Moving slowly or fidgety/restless 0 0    Suicidal thoughts 0 0   PHQ-9 Score 10 0   Difficult doing work/chores Not difficult at all Not difficult at all       01/22/2024   10:15 AM 01/07/2023    9:12 AM 09/10/2018    3:28 PM  GAD 7 : Generalized Anxiety Score  Nervous, Anxious, on Edge 2 0 2  Control/stop worrying 2 0 2  Worry too much - different things 2 0 2  Trouble relaxing 2 0 2  Restless 1 0 2  Easily annoyed or irritable 3 0 1  Afraid - awful might happen 3 0 0  Total GAD 7 Score 15 0 11  Anxiety Difficulty Not difficult at all Not difficult at all       01/22/2024   10:15 AM 01/07/2023    9:12 AM 09/15/2022    9:37 AM  Depression screen PHQ 2/9  Decreased Interest 1 0 0  Down, Depressed, Hopeless 2 0 0  PHQ - 2 Score 3 0 0  Altered sleeping 2 0   Tired, decreased energy 1 0   Change in appetite 0 0   Feeling bad or failure about yourself  2 0   Trouble concentrating 2  0   Moving slowly or fidgety/restless 0 0   Suicidal thoughts 0 0   PHQ-9 Score 10 0   Difficult doing work/chores Not difficult at all Not difficult at all       01/22/2024   10:15 AM 01/07/2023    9:12 AM 09/10/2018    3:28 PM  GAD 7 : Generalized Anxiety Score  Nervous, Anxious, on Edge 2 0 2  Control/stop worrying 2 0 2  Worry too much - different things 2 0 2  Trouble relaxing 2 0 2  Restless 1 0 2  Easily annoyed or irritable 3 0 1  Afraid - awful might happen 3 0 0  Total GAD 7 Score 15 0 11  Anxiety Difficulty Not difficult at all Not difficult at all    SDOH Screenings   Food Insecurity: No Food Insecurity (01/21/2024)  Housing: Unknown (01/21/2024)  Transportation Needs: No Transportation Needs (01/21/2024)  Alcohol Screen: Low Risk  (01/21/2024)  Depression (PHQ2-9): Medium Risk (01/22/2024)  Financial Resource Strain: Low Risk  (01/21/2024)  Physical Activity: Unknown (01/21/2024)  Social Connections: Moderately Isolated (01/21/2024)  Stress: Stress Concern Present (01/21/2024)  Tobacco Use: Medium Risk (01/22/2024)      Physical Exam Constitutional:      General: She is not in acute distress.    Appearance: Normal appearance.  HENT:     Head: Normocephalic and atraumatic.     Right Ear: Tympanic membrane normal.     Left Ear: Tympanic membrane normal.     Mouth/Throat:     Mouth: Mucous membranes are moist.  Neck:     Thyroid : No thyroid  mass or thyroid  tenderness.  Cardiovascular:     Rate and Rhythm: Normal rate and regular rhythm.     Heart sounds: No murmur heard. Pulmonary:     Effort: Pulmonary effort is normal.     Breath sounds: Normal breath sounds.  Abdominal:     General: Abdomen is protuberant. Bowel sounds are normal.     Palpations: Abdomen is soft.     Tenderness: There is no abdominal tenderness. There is no guarding.  Musculoskeletal:     Cervical back: Normal range of motion and neck supple. No rigidity.     Right lower leg: No edema.     Left lower leg: No edema.  Lymphadenopathy:     Cervical: No cervical adenopathy.  Skin:    General: Skin is warm.  Neurological:     Mental Status: She is alert and oriented to person, place, and time.  Psychiatric:        Mood and Affect: Mood normal.        Behavior: Behavior normal.        No results found for any visits on 01/22/24.  The 10-year ASCVD risk score (Arnett DK, et al., 2019) is: 0.8%    Assessment & Plan:  Gastroesophageal reflux disease without esophagitis Assessment & Plan: Chronic issue. Last EGD  11/20/2022 small hiatal hernia. She will continue omeprazole  40 mg daily. Check vitamin B 12 level today.   Bilateral lower extremity edema -     TSH -     CBC -     Comprehensive metabolic panel with GFR  Benign essential HTN Assessment & Plan: Chronic issue.  Adequately controlled.  She will continue valsartan  160 mg daily. Check CMP.  Orders: -     Lipid panel -     Hemoglobin A1c  Generalized anxiety disorder Assessment & Plan: Ongoing for about 9  months.  Plan per MDD and the following:   - Start Hydroxyzine 25 mg, prn nightly to help with sleep. Medication s/e including feeling groggy/drowsy in the forming discussed.   Prediabetes Assessment & Plan: Counseled patient on lifestyle modifications including healthy eating, regular physical activity, and weight management to help prevent the progression from prediabetes to type 2 diabetes. Check A1c today.     Medication management Assessment & Plan: On chronic PPI/Omeprazole , check B12  Orders: -     Vitamin B12 -     VITAMIN D  25 Hydroxy (Vit-D Deficiency, Fractures)  Seasonal allergic rhinitis due to pollen Assessment & Plan: Stable on prn nasal and Flonase  and Claritin . COntinue   Obesity, Class II, BMI 35-39.9 Assessment & Plan: Plan per prediabetes   Depression, major, single episode, moderate (HCC) Assessment & Plan: Reviewed PHQ-9. No SI/HI D/D includes perimenopausal mood change, thyroid  disorder, vitamin d  deficiency, anemia. Check labs. Discussed treatment with HRT, SSRI/SNRI, counseling.  Start conservative management first, f/u in 3 months.  Patient will f/u ob/gyn if conservative management does not help to discuss HRT.   Hiatal hernia Assessment & Plan: Noted on EGD on 11/20/22, symptoms stable on Omeprazole  40 mg daily. Continue.   Other orders -     Valsartan ; Take 1 tablet (160 mg total) by mouth daily.  Dispense: 90 tablet; Refill: 3 -     hydrOXYzine Pamoate; Take 1 capsule (25 mg total) by mouth daily as needed for anxiety.  Dispense: 30 capsule; Refill: 0    Return in about 3 months (around 04/23/2024) for 3 months for mood follow up .   Jacklin Mascot, MD

## 2024-01-22 NOTE — Assessment & Plan Note (Signed)
 Plan per prediabetes

## 2024-01-22 NOTE — Assessment & Plan Note (Signed)
 Ongoing for about 9 months.  Plan per MDD and the following:  - Start Hydroxyzine 25 mg, prn nightly to help with sleep. Medication s/e including feeling groggy/drowsy in the forming discussed.

## 2024-01-22 NOTE — Assessment & Plan Note (Signed)
 Counseled patient on lifestyle modifications including healthy eating, regular physical activity, and weight management to help prevent the progression from prediabetes to type 2 diabetes. Check A1c today.

## 2024-01-22 NOTE — Assessment & Plan Note (Signed)
 On chronic PPI/Omeprazole , check B12

## 2024-01-22 NOTE — Assessment & Plan Note (Signed)
 Chronic issue.  Adequately controlled.  She will continue valsartan  160 mg daily. Check CMP.

## 2024-01-26 ENCOUNTER — Ambulatory Visit: Payer: Self-pay

## 2024-02-15 NOTE — Progress Notes (Addendum)
 Chief Complaint  Patient presents with   Gynecologic Exam    Hot flashes, irritability, depression x 1 year     HPI:      [name] is a 48 y.o. [id number] who LMP was No LMP recorded. Patient has had a hysterectomy., presents today for her annual examination. Her menses are absent due to lap hyst/bilat salpingectomy for RLQ pain 2/19. Dysmenorrhea none. She does not have PMB. Having increased hot flashes, night sweats, trouble sleeping and mood changes for the past yr. Sx ebb and flow. Has tried melatonin for sleep without relief. PCP gave her hydroxyzine  which has helped. Is now exercising too. Started estroven about a month ago.    Sex activity: single partner, contraception - tubal ligation/hyst. Has a little vaginal dryness, improved with lubricant/coconut oil.  Last Pap: 11/30/20  Results were: no abnormalities /neg HPV DNA. No hx of abn paps.     Last mammogram: 07/23/23 at [facility]; Results were normal; 1/22 bx showed PASH LT breast  There is a FH of breast cancer in her PGM, genetic testing not indicated. There is no FH of ovarian cancer. The patient does do self-breast exams.   Tobacco use: quit Alcohol use: rare No drug use Exercise: mod active  Colonoscopy: 3/24 at [facility] due to FH of colon cancer/hx of polyp; 2018 with polyp; repeat due after 3 yrs  She does get adequate calcium and Vitamin D  in her diet.   She has labs with her PCP.    Past Medical History:  Diagnosis Date   Anxiety    Bilateral lower extremity edema 01/07/2023   Cervical radiculopathy 10/03/2019   Family history of colon cancer in mother 07/09/2020   Former smoker 09/10/2018   GERD (gastroesophageal reflux disease)    Hypertension    Skin lesion 12/26/2020    Past Surgical History:  Procedure Laterality Date   ABDOMINAL HYSTERECTOMY     RLQ pain; at [facility]   ABLATION  2013   BREAST REDUCTION SURGERY     BREAST SURGERY  2012   Breast Reduction   COLONOSCOPY WITH PROPOFOL  N/A 06/25/2017    Procedure: COLONOSCOPY WITH PROPOFOL ;  Surgeon: Unk Corinn Skiff, MD;  Location: [facility] ENDOSCOPY;  Service: Gastroenterology;  Laterality: N/A;   REDUCTION MAMMAPLASTY     followed by breast lift   TUBAL LIGATION  2006    Family History  Problem Relation Age of Onset   Colon cancer Mother    Lung cancer Mother    Diabetes Father    Heart attack Father    COPD Paternal Grandmother    Breast cancer Paternal Grandmother 72    Social History   Socioeconomic History   Marital status: Married    Spouse name: Not on file   Number of children: Not on file   Years of education: Not on file   Highest education level: GED or equivalent  Occupational History   Not on file  Tobacco Use   Smoking status: Former    Types: Cigarettes   Smokeless tobacco: Never  Vaping Use   Vaping status: Former  Substance and Sexual Activity   Alcohol use: Yes    Alcohol/week: 0.0 standard drinks of alcohol    Comment: Rare    Drug use: No   Sexual activity: Yes    Partners: Male    Birth control/protection: Surgical    Comment: Hysterectomy  Other Topics Concern   Not on file  Social History Narrative  Married    Employed at [employer]    Some college- highest level    Caffeine- Coffee, tea, and occasional soda    Social Drivers of Corporate investment banker Strain: Low Risk  (01/21/2024)   Overall Financial Resource Strain (CARDIA)    Difficulty of Paying Living Expenses: Not very hard  Food Insecurity: No Food Insecurity (01/21/2024)   Hunger Vital Sign    Worried About Running Out of Food in the Last Year: Never true    Ran Out of Food in the Last Year: Never true  Transportation Needs: No Transportation Needs (01/21/2024)   PRAPARE - Administrator, Civil Service (Medical): No    Lack of Transportation (Non-Medical): No  Physical Activity: Unknown (01/21/2024)   Exercise Vital Sign    Days of Exercise per Week: 0 days    Minutes of Exercise per  Session: Not on file  Stress: Stress Concern Present (01/21/2024)   [employer] of Occupational Health - Occupational Stress Questionnaire    Feeling of Stress : Very much  Social Connections: Moderately Isolated (01/21/2024)   Social Connection and Isolation Panel    Frequency of Communication with Friends and Family: Twice a week    Frequency of Social Gatherings with Friends and Family: Once a week    Attends Religious Services: Never    Database administrator or Organizations: No    Attends Engineer, structural: Not on file    Marital Status: Married  Catering manager Violence: Not on file    Current Outpatient Medications on File Prior to Visit  Medication Sig Dispense Refill   Cholecalciferol (VITAMIN D3) 75 MCG (3000 UT) TABS Take by mouth.     fluticasone  (FLONASE ) 50 MCG/ACT nasal spray Place 2 sprays into both nostrils daily. 16 g 6   hydrOXYzine  (VISTARIL ) 25 MG capsule Take 1 capsule (25 mg total) by mouth daily as needed for anxiety. 30 capsule 0   loratadine  (CLARITIN ) 10 MG tablet Take 1 tablet (10 mg total) by mouth daily. 30 tablet 11   omeprazole  (PRILOSEC) 40 MG capsule Take 1 capsule (40 mg total) by mouth daily. 90 capsule 0   triamcinolone  cream (KENALOG ) 0.1 % Apply 1 application topically 2 (two) times daily. 30 g 0   valsartan  (DIOVAN ) 160 MG tablet Take 1 tablet (160 mg total) by mouth daily. 90 tablet 3   No current facility-administered medications on file prior to visit.      ROS:  Review of Systems  Constitutional:  Negative for fatigue, fever and unexpected weight change.  Respiratory:  Negative for cough, shortness of breath and wheezing.   Cardiovascular:  Negative for chest pain, palpitations and leg swelling.  Gastrointestinal:  Negative for blood in stool, constipation, diarrhea, nausea and vomiting.  Endocrine: Negative for cold intolerance, heat intolerance and polyuria.  Genitourinary:  Negative for dyspareunia, dysuria, flank  pain, frequency, genital sores, hematuria, menstrual problem, pelvic pain, urgency, vaginal bleeding, vaginal discharge and vaginal pain.  Musculoskeletal:  Negative for back pain, joint swelling and myalgias.  Skin:  Negative for rash.  Neurological:  Positive for headaches. Negative for dizziness, syncope, light-headedness and numbness.  Hematological:  Negative for adenopathy.  Psychiatric/Behavioral:  Positive for agitation. Negative for confusion, sleep disturbance and suicidal ideas. The patient is not nervous/anxious.      Objective: BP 115/71   Pulse 83   Ht 5' 7 (1.702 m)   Wt 223 lb (101.2 kg)  BMI 34.93 kg/m    Physical Exam Constitutional:      Appearance: She is well-developed.  Genitourinary:     Vulva normal.     Genitourinary Comments: UTERUS/CX SURG REM     Right Labia: No rash, tenderness or lesions.    Left Labia: No tenderness, lesions or rash.    Vaginal cuff intact.    No vaginal discharge, erythema or tenderness.      Right Adnexa: not tender and no mass present.    Left Adnexa: not tender and no mass present.    Cervix is absent.     Uterus is absent.  Breasts:    Right: No mass, nipple discharge, skin change or tenderness.     Left: No mass, nipple discharge, skin change or tenderness.  Neck:     Thyroid : No thyromegaly.   Cardiovascular:     Rate and Rhythm: Normal rate and regular rhythm.     Heart sounds: Normal heart sounds. No murmur heard. Pulmonary:     Effort: Pulmonary effort is normal.     Breath sounds: Normal breath sounds.  Abdominal:     Palpations: Abdomen is soft.     Tenderness: There is no abdominal tenderness. There is no guarding.   Musculoskeletal:        General: Normal range of motion.     Cervical back: Normal range of motion.   Neurological:     General: No focal deficit present.     Mental Status: She is alert and oriented to person, place, and time.     Cranial Nerves: No cranial nerve deficit.   Skin:     General: Skin is warm and dry.   Psychiatric:        Mood and Affect: Mood normal.        Behavior: Behavior normal.        Thought Content: Thought content normal.        Judgment: Judgment normal.  Vitals reviewed.     Assessment/Plan: Encounter for annual routine gynecological examination  Encounter for screening mammogram for malignant neoplasm of breast - Plan: MM 3D SCREENING MAMMOGRAM BILATERAL BREAST; pt to schedule mammo  Perimenopausal vasomotor symptoms - Plan: FSH/LH, Estradiol ; check labs. Discussed HRT vs veozah. Will see what labs show.         GYN counsel breast self exam, mammography screening, adequate intake of calcium and vitamin D , diet and exercise, tob cessation     F/U  Return in about 1 year (around 02/15/2025).  Neill Jurewicz B. Perlie Scheuring, PA-C 02/16/2024 9:49 AM

## 2024-02-16 ENCOUNTER — Ambulatory Visit (INDEPENDENT_AMBULATORY_CARE_PROVIDER_SITE_OTHER): Admitting: Obstetrics and Gynecology

## 2024-02-16 ENCOUNTER — Encounter: Payer: Self-pay | Admitting: Obstetrics and Gynecology

## 2024-02-16 VITALS — BP 115/71 | HR 83 | Ht 67.0 in | Wt 223.0 lb

## 2024-02-16 DIAGNOSIS — N951 Menopausal and female climacteric states: Secondary | ICD-10-CM

## 2024-02-16 DIAGNOSIS — Z01419 Encounter for gynecological examination (general) (routine) without abnormal findings: Secondary | ICD-10-CM | POA: Diagnosis not present

## 2024-02-16 DIAGNOSIS — Z1231 Encounter for screening mammogram for malignant neoplasm of breast: Secondary | ICD-10-CM

## 2024-02-16 NOTE — Patient Instructions (Signed)
 I value your feedback and you entrusting [place] with your care. If you get a [place] patient survey, I would appreciate you taking the time to let us know about your experience today. Thank you! ? ? ?

## 2024-02-17 ENCOUNTER — Ambulatory Visit: Payer: Self-pay | Admitting: Obstetrics and Gynecology

## 2024-02-17 LAB — FSH/LH
FSH: 5.7 m[IU]/mL
LH: 3.8 m[IU]/mL

## 2024-02-17 LAB — ESTRADIOL: Estradiol: 84.8 pg/mL

## 2024-02-22 ENCOUNTER — Other Ambulatory Visit: Payer: Self-pay

## 2024-03-14 ENCOUNTER — Other Ambulatory Visit: Payer: Self-pay | Admitting: Internal Medicine

## 2024-03-14 DIAGNOSIS — K219 Gastro-esophageal reflux disease without esophagitis: Secondary | ICD-10-CM

## 2024-04-22 ENCOUNTER — Other Ambulatory Visit (HOSPITAL_COMMUNITY): Payer: Self-pay

## 2024-04-22 ENCOUNTER — Telehealth: Payer: Self-pay

## 2024-04-22 ENCOUNTER — Other Ambulatory Visit: Payer: Self-pay

## 2024-04-22 ENCOUNTER — Ambulatory Visit (INDEPENDENT_AMBULATORY_CARE_PROVIDER_SITE_OTHER)

## 2024-04-22 VITALS — BP 106/74 | HR 73 | Temp 98.5°F | Ht 67.0 in | Wt 220.8 lb

## 2024-04-22 DIAGNOSIS — E66811 Obesity, class 1: Secondary | ICD-10-CM | POA: Diagnosis not present

## 2024-04-22 DIAGNOSIS — K219 Gastro-esophageal reflux disease without esophagitis: Secondary | ICD-10-CM

## 2024-04-22 DIAGNOSIS — F411 Generalized anxiety disorder: Secondary | ICD-10-CM

## 2024-04-22 DIAGNOSIS — R7303 Prediabetes: Secondary | ICD-10-CM

## 2024-04-22 DIAGNOSIS — Z6834 Body mass index (BMI) 34.0-34.9, adult: Secondary | ICD-10-CM

## 2024-04-22 DIAGNOSIS — J309 Allergic rhinitis, unspecified: Secondary | ICD-10-CM

## 2024-04-22 DIAGNOSIS — E782 Mixed hyperlipidemia: Secondary | ICD-10-CM | POA: Diagnosis not present

## 2024-04-22 DIAGNOSIS — F321 Major depressive disorder, single episode, moderate: Secondary | ICD-10-CM | POA: Diagnosis not present

## 2024-04-22 DIAGNOSIS — I10 Essential (primary) hypertension: Secondary | ICD-10-CM

## 2024-04-22 DIAGNOSIS — Z79899 Other long term (current) drug therapy: Secondary | ICD-10-CM

## 2024-04-22 MED ORDER — VENLAFAXINE HCL ER 37.5 MG PO CP24: 37.5000 mg | ORAL_CAPSULE | Freq: Every day | ORAL | 0 refills | Status: DC

## 2024-04-22 MED ORDER — HYDROXYZINE PAMOATE 25 MG PO CAPS: 25.0000 mg | ORAL_CAPSULE | Freq: Every day | ORAL | 1 refills | Status: AC | PRN

## 2024-04-22 MED ORDER — FLUTICASONE PROPIONATE 50 MCG/ACT NA SUSP: 2.0000 | Freq: Every day | NASAL | 2 refills | Status: AC

## 2024-04-22 MED ORDER — WEGOVY 0.25 MG/0.5ML ~~LOC~~ SOAJ: 0.2500 mg | SUBCUTANEOUS | 1 refills | Status: AC

## 2024-04-22 MED ORDER — OMEPRAZOLE 40 MG PO CPDR: 40.0000 mg | DELAYED_RELEASE_CAPSULE | Freq: Every day | ORAL | 3 refills | Status: AC

## 2024-04-22 MED ORDER — VENLAFAXINE HCL ER 75 MG PO CP24: 75.0000 mg | ORAL_CAPSULE | Freq: Every day | ORAL | 0 refills | Status: DC

## 2024-04-22 NOTE — Progress Notes (Signed)
 Established Patient Office Visit   Subjective  Patient ID: Amy Day, female    DOB: Jan 11, 1976  Age: 48 y.o. MRN: [id number]  Chief Complaint  Patient presents with   Gastroesophageal Reflux    She  has a past medical history of Anxiety, Bilateral lower extremity edema (01/07/2023), Cervical radiculopathy (10/03/2019), Family history of colon cancer in mother (07/09/2020), Former smoker (09/10/2018), GERD (gastroesophageal reflux disease), Hypertension, and Skin lesion (12/26/2020).  HPI Discussed the use of AI scribe software for clinical note transcription with the patient, who gave verbal consent to proceed.  History of Present Illness Amy Day is a 48 year old female who presents for follow-up on chronic conditions.   - Obesity: Patient has been exercising regularly, eating healthy and has noted some weight loss.  She is interested in pharmacological intervention with lifestyle modifications to help with weight loss and reduce obesity related comorbidities.  She has elevated LDL cholesterol, prediabetes, hypertension. She has been exercising more, engaging in brisk walking for 30 minutes during lunch breaks. She expresses interest in weight loss medications, mentioning a family history of cardiovascular issues, including her father's and grandparents' heart attacks, and her own high blood pressure. She is concerned about insurance coverage for such medications.  - Patient sees OB/GYN annually.  There was concern for postmenopausal mood changes.  She had labs done on 02/16/2024 through her gynecologist to evaluate for hormonal changes.  She had normal estradiol , FSH to LH ratio.    - Mood disorder: Patient has been taking hydroxyzine  25 mg as needed mostly on Sunday-Tuesday. She has considered talk therapy for anxiety but finds it too expensive. She has been communicating more with her husband about her mood, which she feels has improved.  Patient reports mood could be improved.  She describes difficulty sleeping on nights before workdays.  - She has a history of GERD, hiatal hernia and has been taking omeprazole  40 mg daily for few years now.  Her B12 on 01/22/2024 was on the low normal level.  She has been taking B12 1000 mcg daily.    - She has noticed sensation of fullness in the left ear.  She has history of allergic rhinitis and has been prescribed Flonase  in the past.  Patient reports he ran out of the medication.  No no fever, chills, cough.     ROS As per HPI     Objective:     BP 106/74 (BP Location: Right Arm, Patient Position: Sitting, Cuff Size: Normal)   Pulse 73   Temp 98.5 F (36.9 C) (Oral)   Ht 5' 7 (1.702 m)   Wt 220 lb 12.8 oz (100.2 kg)   SpO2 96%   BMI 34.58 kg/m      04/22/2024    8:14 AM 02/16/2024    9:29 AM 01/22/2024   10:15 AM  Depression screen PHQ 2/9  Decreased Interest 0 0 1  Down, Depressed, Hopeless 0 1 2  PHQ - 2 Score 0 1 3  Altered sleeping 0 3 2  Tired, decreased energy 0 0 1  Change in appetite 0 0 0  Feeling bad or failure about yourself  1 1 2   Trouble concentrating 0 0 2  Moving slowly or fidgety/restless 0 0 0  Suicidal thoughts 0 0 0  PHQ-9 Score 1 5 10   Difficult doing work/chores Not difficult at all  Not difficult at all      04/22/2024    8:14 AM 02/16/2024  9:30 AM 01/22/2024   10:15 AM 01/07/2023    9:12 AM  GAD 7 : Generalized Anxiety Score  Nervous, Anxious, on Edge 0  2 0  Control/stop worrying 0 1 2 0  Worry too much - different things 0 1 2 0  Trouble relaxing 0 2 2 0  Restless 0 0 1 0  Easily annoyed or irritable 1 2 3  0  Afraid - awful might happen 1 2 3  0  Total GAD 7 Score 2  15 0  Anxiety Difficulty Not difficult at all  Not difficult at all Not difficult at all      04/22/2024    8:14 AM 02/16/2024    9:29 AM 01/22/2024   10:15 AM  Depression screen PHQ 2/9  Decreased Interest 0 0 1  Down, Depressed, Hopeless 0 1 2  PHQ - 2 Score 0 1 3  Altered sleeping 0 3 2  Tired,  decreased energy 0 0 1  Change in appetite 0 0 0  Feeling bad or failure about yourself  1 1 2   Trouble concentrating 0 0 2  Moving slowly or fidgety/restless 0 0 0  Suicidal thoughts 0 0 0  PHQ-9 Score 1 5 10   Difficult doing work/chores Not difficult at all  Not difficult at all      04/22/2024    8:14 AM 02/16/2024    9:30 AM 01/22/2024   10:15 AM 01/07/2023    9:12 AM  GAD 7 : Generalized Anxiety Score  Nervous, Anxious, on Edge 0  2 0  Control/stop worrying 0 1 2 0  Worry too much - different things 0 1 2 0  Trouble relaxing 0 2 2 0  Restless 0 0 1 0  Easily annoyed or irritable 1 2 3  0  Afraid - awful might happen 1 2 3  0  Total GAD 7 Score 2  15 0  Anxiety Difficulty Not difficult at all  Not difficult at all Not difficult at all   SDOH Screenings   Food Insecurity: No Food Insecurity (04/21/2024)  Housing: Unknown (04/21/2024)  Transportation Needs: No Transportation Needs (04/21/2024)  Alcohol Screen: Low Risk  (04/21/2024)  Depression (PHQ2-9): Low Risk  (04/22/2024)  Recent Concern: Depression (PHQ2-9) - Medium Risk (02/16/2024)  Financial Resource Strain: Low Risk  (04/21/2024)  Physical Activity: Insufficiently Active (04/21/2024)  Social Connections: Moderately Isolated (04/21/2024)  Stress: No Stress Concern Present (04/21/2024)  Tobacco Use: Medium Risk (04/22/2024)     Physical Exam Constitutional:      General: She is not in acute distress.    Appearance: She is obese.  HENT:     Head: Normocephalic and atraumatic.     Right Ear: Tympanic membrane and external ear normal. There is no impacted cerumen.     Left Ear: Tympanic membrane and external ear normal. There is no impacted cerumen.     Mouth/Throat:     Mouth: Mucous membranes are moist.     Pharynx: No oropharyngeal exudate.  Cardiovascular:     Rate and Rhythm: Normal rate.     Heart sounds: No murmur heard. Pulmonary:     Effort: Pulmonary effort is normal.     Breath sounds: Normal breath sounds.  No wheezing or rales.  Abdominal:     General: Abdomen is protuberant.     Tenderness: There is no abdominal tenderness.  Musculoskeletal:     Cervical back: Neck supple.     Right lower leg: No edema.  Left lower leg: No edema.  Lymphadenopathy:     Cervical: No cervical adenopathy.  Skin:    General: Skin is warm.  Neurological:     Mental Status: She is alert and oriented to person, place, and time.  Psychiatric:        Mood and Affect: Mood normal.        No results found for any visits on 04/22/24.  The 10-year ASCVD risk score (Arnett DK, et al., 2019) is: 0.8%     Assessment & Plan:   Class 1 obesity with serious comorbidity and body mass index (BMI) of 34.0 to 34.9 in adult, unspecified obesity type Assessment & Plan: - Obesity with comorbidities including hypertension, prediabetes, mixed hyperlipidemia. Discussed continuing lifestyle modification with weight loss medications, Wegovy  and Zepbound, for weight loss. No contraindications, no family history of medullary thyroid  cancer, MEN, no history of pancreatitis in patient. Insurance coverage may be a barrier. Emphasized cardiovascular and strength exercises to prevent muscle mass loss. - Prescribe Wegovy  0.25 mg weekly with one refill, pending insurance approval.  Medication side effect including abdominal pain, increased risk of pancreatitis, bloating, diarrhea, nausea discussed with the patient. - Plan follow-up in 6-8 weeks to assess weight loss progress and medication tolerance.  Orders: -     Wegovy ; Inject 0.25 mg into the skin once a week.  Dispense: 2 mL; Refill: 1  Mixed hyperlipidemia Assessment & Plan: Recommend checking fasting lipid panel during next visit.  Future due lab ordered.  Continue lifestyle modification.  Orders: -     Lipid panel; Future  Depression, major, single episode, moderate (HCC) Assessment & Plan: Generalized anxiety disorder and depression managed with as-needed  hydroxyzine .  Discussed starting venlafaxine  for mood stabilization. Discussed small risk of increased depression or anxiety, less likely in this age group. Emphasized monitoring for paradoxical effects. Prescribe venlafaxine  37.5 mg daily for 2 weeks, then increase to 75 mg daily with a 66-month supply. Plan virtual follow-up in 6-8 weeks to assess mood and medication tolerance. Continue hydroxyzine  25 mg as needed, with a prescription for 90 capsules and one refill.  Orders: -     Venlafaxine  HCl ER; Take 1 capsule (37.5 mg total) by mouth daily with breakfast.  Dispense: 14 capsule; Refill: 0 -     Venlafaxine  HCl ER; Take 1 capsule (75 mg total) by mouth daily with breakfast.  Dispense: 90 capsule; Refill: 0  Generalized anxiety disorder Assessment & Plan: Generalized anxiety disorder and depression managed with as-needed hydroxyzine .  Discussed starting venlafaxine  for mood stabilization. Discussed small risk of increased depression or anxiety, less likely in this age group. Emphasized monitoring for paradoxical effects. Prescribe venlafaxine  37.5 mg daily for 2 weeks, then increase to 75 mg daily with a 5-month supply. Plan virtual follow-up in 6-8 weeks to assess mood and medication tolerance. Continue hydroxyzine  25 mg as needed, with a prescription for 90 capsules and one refill.  Orders: -     hydrOXYzine  Pamoate; Take 1 capsule (25 mg total) by mouth daily as needed.  Dispense: 90 capsule; Refill: 1 -     Venlafaxine  HCl ER; Take 1 capsule (37.5 mg total) by mouth daily with breakfast.  Dispense: 14 capsule; Refill: 0 -     Venlafaxine  HCl ER; Take 1 capsule (75 mg total) by mouth daily with breakfast.  Dispense: 90 capsule; Refill: 0  Gastroesophageal reflux disease, unspecified whether esophagitis present Assessment & Plan: Symptoms well-controlled on omeprazole  40 mg daily, continue.  Orders: -  Omeprazole ; Take 1 capsule (40 mg total) by mouth daily.  Dispense: 90  capsule; Refill: 3  Allergic rhinitis, unspecified seasonality, unspecified trigger Assessment & Plan: Start nasal Flonase , 2 puffs in each nostril daily for 1 week then as needed.  Refill sent.  Orders: -     Fluticasone  Propionate; Place 2 sprays into both nostrils daily.  Dispense: 18.2 mL; Refill: 2  Prediabetes Assessment & Plan: Check A1c during follow-up visit.  Orders: -     Hemoglobin A1c; Future  Medication management Assessment & Plan: On chronic omeprazole , check B12 during follow-up visit.  Future lab ordered.  Continue B12 1000 mcg daily.  Orders: -     Vitamin B12; Future  Benign essential HTN Assessment & Plan: Blood pressure within goal today.  Continue valsartan  160 mg daily.    I personally spent a total of 45 minutes in the care of the patient today including preparing to see the patient, performing a medically appropriate exam/evaluation, counseling and educating, placing orders, and documenting clinical information in the EHR.  Return in about 6 weeks (around 06/03/2024) for 6-8 weeks for f/u on mood, weight.   Luke Shade, MD

## 2024-04-22 NOTE — Assessment & Plan Note (Signed)
 Check A1c during follow-up visit.

## 2024-04-22 NOTE — Assessment & Plan Note (Signed)
 On chronic omeprazole , check B12 during follow-up visit.  Future lab ordered.  Continue B12 1000 mcg daily.

## 2024-04-22 NOTE — Assessment & Plan Note (Signed)
 Generalized anxiety disorder and depression managed with as-needed hydroxyzine .  Discussed starting venlafaxine  for mood stabilization. Discussed small risk of increased depression or anxiety, less likely in this age group. Emphasized monitoring for paradoxical effects. Prescribe venlafaxine  37.5 mg daily for 2 weeks, then increase to 75 mg daily with a 69-month supply. Plan virtual follow-up in 6-8 weeks to assess mood and medication tolerance. Continue hydroxyzine  25 mg as needed, with a prescription for 90 capsules and one refill.

## 2024-04-22 NOTE — Assessment & Plan Note (Signed)
 Blood pressure within goal today.  Continue valsartan  160 mg daily.

## 2024-04-22 NOTE — Telephone Encounter (Signed)
 PA request has been Submitted. New Encounter has been or will be created for follow up. For additional info see Pharmacy Prior Auth telephone encounter from 04/22/24.

## 2024-04-22 NOTE — Assessment & Plan Note (Signed)
 Symptoms well-controlled on omeprazole  40 mg daily, continue.

## 2024-04-22 NOTE — Telephone Encounter (Signed)
 Pharmacy Patient Advocate Encounter   Received notification from Pt Calls Messages that prior authorization for Wegovy  0.25MG /0.5ML auto-injectors is required/requested.   Insurance verification completed.   The patient is insured through [facility]  .   Per test claim: PA required; PA submitted to above mentioned insurance via Latent Key/confirmation #/EOC [id number] Status is pending

## 2024-04-22 NOTE — Assessment & Plan Note (Signed)
-   Obesity with comorbidities including hypertension, prediabetes, mixed hyperlipidemia. Discussed continuing lifestyle modification with weight loss medications, Wegovy  and Zepbound, for weight loss. No contraindications, no family history of medullary thyroid  cancer, MEN, no history of pancreatitis in patient. Insurance coverage may be a barrier. Emphasized cardiovascular and strength exercises to prevent muscle mass loss. - Prescribe Wegovy  0.25 mg weekly with one refill, pending insurance approval.  Medication side effect including abdominal pain, increased risk of pancreatitis, bloating, diarrhea, nausea discussed with the patient. - Plan follow-up in 6-8 weeks to assess weight loss progress and medication tolerance.

## 2024-04-22 NOTE — Assessment & Plan Note (Signed)
 Start nasal Flonase , 2 puffs in each nostril daily for 1 week then as needed.  Refill sent.

## 2024-04-22 NOTE — Assessment & Plan Note (Signed)
 Recommend checking fasting lipid panel during next visit.  Future due lab ordered.  Continue lifestyle modification.

## 2024-04-28 ENCOUNTER — Other Ambulatory Visit (HOSPITAL_COMMUNITY): Payer: Self-pay

## 2024-04-29 ENCOUNTER — Other Ambulatory Visit (HOSPITAL_COMMUNITY): Payer: Self-pay

## 2024-04-29 NOTE — Telephone Encounter (Signed)
 Called insurance to check status. Per representative, they have not received the PA. Representative is faxing a PA form. Will complete and send back once received.   Phone: [phone]

## 2024-04-29 NOTE — Telephone Encounter (Signed)
 PA form and chart notes submitted via fax.   Fax: [phone]

## 2024-05-03 ENCOUNTER — Other Ambulatory Visit (HOSPITAL_COMMUNITY): Payer: Self-pay

## 2024-05-03 NOTE — Telephone Encounter (Signed)
 Pharmacy Patient Advocate Encounter  Received notification from  [facility]  that Prior Authorization for Wegovy  0.25MG /0.5ML auto-injectors has been APPROVED from 04/29/24 to 10/31/24. Ran test claim, Copay is $325. This test claim was processed through [facility]- copay amounts may vary at other pharmacies due to pharmacy/plan contracts, or as the patient moves through the different stages of their insurance plan.   PA #/Case ID/Reference #: [id number]

## 2024-05-05 ENCOUNTER — Other Ambulatory Visit (HOSPITAL_COMMUNITY): Payer: Self-pay

## 2024-05-18 ENCOUNTER — Other Ambulatory Visit (HOSPITAL_COMMUNITY): Payer: Self-pay

## 2024-06-06 ENCOUNTER — Ambulatory Visit

## 2024-06-06 DIAGNOSIS — Z23 Encounter for immunization: Secondary | ICD-10-CM

## 2024-06-16 ENCOUNTER — Ambulatory Visit

## 2024-06-24 ENCOUNTER — Telehealth: Payer: Self-pay

## 2024-06-24 NOTE — Telephone Encounter (Signed)
 Left VM and sent MyChart message to let pt know OV needs to be rescheduled from 07/12/24.   E2C2, please reschedule if pt calls back -[phone]

## 2024-07-12 ENCOUNTER — Ambulatory Visit (INDEPENDENT_AMBULATORY_CARE_PROVIDER_SITE_OTHER)

## 2024-07-12 ENCOUNTER — Ambulatory Visit: Payer: Self-pay

## 2024-07-12 ENCOUNTER — Ambulatory Visit

## 2024-07-12 ENCOUNTER — Other Ambulatory Visit: Payer: Self-pay

## 2024-07-12 VITALS — BP 100/80 | HR 80 | Temp 97.7°F | Ht 67.0 in | Wt 215.2 lb

## 2024-07-12 DIAGNOSIS — E782 Mixed hyperlipidemia: Secondary | ICD-10-CM

## 2024-07-12 DIAGNOSIS — I10 Essential (primary) hypertension: Secondary | ICD-10-CM | POA: Diagnosis not present

## 2024-07-12 DIAGNOSIS — Z79899 Other long term (current) drug therapy: Secondary | ICD-10-CM

## 2024-07-12 DIAGNOSIS — E66811 Obesity, class 1: Secondary | ICD-10-CM

## 2024-07-12 DIAGNOSIS — E538 Deficiency of other specified B group vitamins: Secondary | ICD-10-CM | POA: Diagnosis not present

## 2024-07-12 DIAGNOSIS — Z1159 Encounter for screening for other viral diseases: Secondary | ICD-10-CM

## 2024-07-12 DIAGNOSIS — F411 Generalized anxiety disorder: Secondary | ICD-10-CM | POA: Diagnosis not present

## 2024-07-12 DIAGNOSIS — F3341 Major depressive disorder, recurrent, in partial remission: Secondary | ICD-10-CM | POA: Diagnosis not present

## 2024-07-12 DIAGNOSIS — Z6834 Body mass index (BMI) 34.0-34.9, adult: Secondary | ICD-10-CM

## 2024-07-12 DIAGNOSIS — R7303 Prediabetes: Secondary | ICD-10-CM

## 2024-07-12 DIAGNOSIS — L659 Nonscarring hair loss, unspecified: Secondary | ICD-10-CM

## 2024-07-12 LAB — HEMOGLOBIN A1C: Hgb A1c MFr Bld: 5.2 % (ref 4.6–6.5)

## 2024-07-12 LAB — LIPID PANEL
Cholesterol: 205 mg/dL — ABNORMAL HIGH (ref 0–200)
HDL: 59.6 mg/dL (ref >39.00)
LDL Cholesterol: 110 mg/dL — ABNORMAL HIGH (ref 0–99)
NonHDL: 145.18
Total CHOL/HDL Ratio: 3
Triglycerides: 178 mg/dL — ABNORMAL HIGH (ref 0.0–149.0)
VLDL: 35.6 mg/dL (ref 0.0–40.0)

## 2024-07-12 LAB — VITAMIN B12: Vitamin B-12: 1138 pg/mL — ABNORMAL HIGH (ref 211–911)

## 2024-07-12 MED ORDER — VENLAFAXINE HCL ER 75 MG PO CP24: 75.0000 mg | ORAL_CAPSULE | Freq: Every day | ORAL | 3 refills | Status: AC

## 2024-07-12 NOTE — Patient Instructions (Signed)
 If you continue to notice hair fall try Minoxidil 5% twice a day, you can get this at any pharmacy. If hair fall continues to be a problem despite this recommend dermatology evaluation.

## 2024-07-12 NOTE — Assessment & Plan Note (Addendum)
 Depression and anxiety well-managed with venlafaxine  75 mg daily. No current depressive symptoms. Hydroxyzine  25 mg at bedtime used as needed for anxiety. Continue venlafaxine  75 mg daily. Continue hydroxyzine  as needed for anxiety. Follow up in 6 months.  Orders:   venlafaxine  XR (EFFEXOR  XR) 75 MG 24 hr capsule; Take 1 capsule (75 mg total) by mouth daily with breakfast.

## 2024-07-12 NOTE — Assessment & Plan Note (Addendum)
 Noted B12 level at 276 on 01/22/24, ideal goal B12 400 or higher. Continue daily multivitamin, check B12 level today. Will continue to follow up periodically. Further management pending result.  Orders:   B12; Future

## 2024-07-12 NOTE — Progress Notes (Signed)
 Established Patient Office Visit   Subjective  Patient ID: Amy Day, female    DOB: 12/03/1975  Age: 48 y.o. MRN: [id number]  Chief Complaint  Patient presents with   Weight Check    Discussed the use of AI scribe software for clinical note transcription with the patient, who gave verbal consent to proceed.  History of Present Illness Amy Day is a 48 year old female who presents for follow-up regarding weight management, chronic medication follow up.   - Weight management: She is currently on semaglutide , for weight loss, obtained through a company called [facility].  She started on a dose of 0.25 units and has been increasing by 4 units per week. She experiences nausea on the first day of taking the medication, which subsides after eating a saltine cracker. She has lost weight, decreasing from 220 pounds during her last visit in August compared to 215 pounds during today's visit.   - Mood has been stable since starting Venlafaxine  75 mg daily. She noted some hair fall since starting Venlafaxine . She was recommended to try Minoxidil 2% and is hesitant on changing treatment since it has helped with improving depression symptoms.   She takes Hydroxyzine  25 mg prn mostly during weeknights which helps with her anxiety. She is not currently going through counseling.  No thoughts of self-harm and she feels she is in a good mental space.  - Takes Valsartan  160 mg daily.  No chest pain, swelling, abdominal pain, vomiting, or diarrhea.   - She has a history of elevated cholesterol and is not currently on medication for it. She is following lifestyle modifications and is hopeful that weight loss from semaglutide  will improve her cholesterol levels. She is also taking a daily multivitamin.  - B12 from 01/22/24 was 276. A1c in prediabetic range at 5.7%. She is fasting this morning.     ROS As per HPI    Objective:     BP 100/80 (BP Location: Right Arm, Patient Position: Sitting,  Cuff Size: Normal)   Pulse 80   Temp 97.7 F (36.5 C) (Oral)   Ht 5' 7 (1.702 m)   Wt 215 lb 3.2 oz (97.6 kg)   SpO2 98%   BMI 33.71 kg/m      07/12/2024    8:11 AM 04/22/2024    8:14 AM 02/16/2024    9:29 AM  Depression screen PHQ 2/9  Decreased Interest 0 0 0  Down, Depressed, Hopeless 0 0 1  PHQ - 2 Score 0 0 1  Altered sleeping 0 0 3  Tired, decreased energy 0 0 0  Change in appetite 0 0 0  Feeling bad or failure about yourself  0 1 1  Trouble concentrating 0 0 0  Moving slowly or fidgety/restless 0 0 0  Suicidal thoughts 0 0 0  PHQ-9 Score 0 1  5   Difficult doing work/chores Not difficult at all Not difficult at all      Data saved with a previous flowsheet row definition      07/12/2024    8:11 AM 04/22/2024    8:14 AM 02/16/2024    9:30 AM 01/22/2024   10:15 AM  GAD 7 : Generalized Anxiety Score  Nervous, Anxious, on Edge 0 0  2  Control/stop worrying 0 0 1 2  Worry too much - different things 0 0 1 2  Trouble relaxing 0 0 2 2  Restless 0 0 0 1  Easily annoyed or irritable 0 1  2 3  Afraid - awful might happen 0 1 2 3   Total GAD 7 Score 0 2  15  Anxiety Difficulty Not difficult at all Not difficult at all  Not difficult at all      07/12/2024    8:11 AM 04/22/2024    8:14 AM 02/16/2024    9:29 AM  Depression screen PHQ 2/9  Decreased Interest 0 0 0  Down, Depressed, Hopeless 0 0 1  PHQ - 2 Score 0 0 1  Altered sleeping 0 0 3  Tired, decreased energy 0 0 0  Change in appetite 0 0 0  Feeling bad or failure about yourself  0 1 1  Trouble concentrating 0 0 0  Moving slowly or fidgety/restless 0 0 0  Suicidal thoughts 0 0 0  PHQ-9 Score 0 1  5   Difficult doing work/chores Not difficult at all Not difficult at all      Data saved with a previous flowsheet row definition      07/12/2024    8:11 AM 04/22/2024    8:14 AM 02/16/2024    9:30 AM 01/22/2024   10:15 AM  GAD 7 : Generalized Anxiety Score  Nervous, Anxious, on Edge 0 0  2  Control/stop  worrying 0 0 1 2  Worry too much - different things 0 0 1 2  Trouble relaxing 0 0 2 2  Restless 0 0 0 1  Easily annoyed or irritable 0 1 2 3   Afraid - awful might happen 0 1 2 3   Total GAD 7 Score 0 2  15  Anxiety Difficulty Not difficult at all Not difficult at all  Not difficult at all   SDOH Screenings   Food Insecurity: No Food Insecurity (04/21/2024)  Housing: Unknown (04/21/2024)  Transportation Needs: No Transportation Needs (04/21/2024)  Alcohol Screen: Low Risk  (04/21/2024)  Depression (PHQ2-9): Low Risk  (07/12/2024)  Financial Resource Strain: Low Risk  (04/21/2024)  Physical Activity: Insufficiently Active (04/21/2024)  Social Connections: Moderately Isolated (04/21/2024)  Stress: No Stress Concern Present (04/21/2024)  Tobacco Use: Medium Risk (07/12/2024)     Physical Exam Constitutional:      General: She is not in acute distress.    Appearance: Normal appearance. She is obese.  HENT:     Head: Normocephalic and atraumatic.     Mouth/Throat:     Mouth: Mucous membranes are moist.     Pharynx: Oropharynx is clear.  Neck:     Thyroid : No thyroid  mass or thyroid  tenderness.  Cardiovascular:     Rate and Rhythm: Normal rate and regular rhythm.  Pulmonary:     Effort: Pulmonary effort is normal.     Breath sounds: Normal breath sounds.  Abdominal:     General: Bowel sounds are normal.     Palpations: Abdomen is soft.  Musculoskeletal:     Cervical back: Neck supple. No rigidity or tenderness.     Right lower leg: No edema.     Left lower leg: No edema.  Skin:    General: Skin is warm.  Neurological:     Mental Status: She is alert and oriented to person, place, and time.  Psychiatric:        Mood and Affect: Mood normal.        Speech: Speech normal.        Behavior: Behavior normal. Behavior is cooperative.        No results found for any visits on 07/12/24.  The 10-year  ASCVD risk score (Arnett DK, et al., 2019) is: 0.7%     Assessment & Plan:    Assessment & Plan Benign essential HTN Hypertension managed with valsartan  160 mg daily, continue. Repeat CMP during her prior to follow up visit in 6 months.  Orders:   Comp Met (CMET); Future  Recurrent major depressive disorder, in partial remission Depression and anxiety well-managed with venlafaxine  75 mg daily. No current depressive symptoms. Hydroxyzine  25 mg at bedtime used as needed for anxiety. Continue venlafaxine  75 mg daily. Continue hydroxyzine  as needed for anxiety. Follow up in 6 months.  Orders:   venlafaxine  XR (EFFEXOR  XR) 75 MG 24 hr capsule; Take 1 capsule (75 mg total) by mouth daily with breakfast.  Generalized anxiety disorder Plan per depression from today's visit.  Orders:   venlafaxine  XR (EFFEXOR  XR) 75 MG 24 hr capsule; Take 1 capsule (75 mg total) by mouth daily with breakfast.  Encounter for hepatitis C screening test for low risk patient Hepatitis C screening lab ordered.  Orders:   Hepatitis C Antibody  Prediabetes Repeat A1c today.  Orders:   HgB A1c; Future  Mixed hyperlipidemia The 10-year ASCVD risk score (Arnett DK, et al., 2019) is: 0.7%   Values used to calculate the score:     Age: 31 years     Clincally relevant sex: Female     Is Non-Hispanic African American: No     Diabetic: No     Tobacco smoker: No     Systolic Blood Pressure: 100 mmHg     Is BP treated: Yes     HDL Cholesterol: 67.6 mg/dL     Total Cholesterol: 233 mg/dL Continue healthy lifestyle modifications. Check fasting lipid panel today.  Orders:   Lipid panel; Future  Class 1 obesity with serious comorbidity and body mass index (BMI) of 34.0 to 34.9 in adult, unspecified obesity type Continue follow up with [facility] for medication refill/management. Encouraged continued lifestyle modifications to help with weight loss efforts.     Low serum vitamin B12 Noted B12 level at 276 on 01/22/24, ideal goal B12 400 or higher. Continue daily multivitamin, check B12  level today. Will continue to follow up periodically. Further management pending result.  Orders:   B12; Future  Falling hair Potential s/e to medication (Venlafaxine , Semaglutide ), stress. She has noted significant mood improvement on Venlafaxine  and does not want to stop taking this medication.  Recommend using Minoxidil 5% BID, if continues to notice hair fall recommend referral to dermatology for further evaluation. Use gloves and wash hands thoroughly after application.      Return in about 6 months (around 01/09/2025) for Chronic follow up with Dr. Abbey, fasting labs couple of days before appointment .   Luke Abbey, MD

## 2024-07-12 NOTE — Assessment & Plan Note (Addendum)
 Continue follow up with [facility] for medication refill/management. Encouraged continued lifestyle modifications to help with weight loss efforts.

## 2024-07-12 NOTE — Progress Notes (Signed)
 The 10-year ASCVD risk score (Arnett DK, et al., 2019) is: 0.7%   Values used to calculate the score:     Age: 48 years     Clincally relevant sex: Female     Is Non-Hispanic African American: No     Diabetic: No     Tobacco smoker: No     Systolic Blood Pressure: 100 mmHg     Is BP treated: Yes     HDL Cholesterol: 59.6 mg/dL     Total Cholesterol: 205 mg/dL

## 2024-07-12 NOTE — Assessment & Plan Note (Addendum)
 Plan per depression from today's visit.  Orders:   venlafaxine  XR (EFFEXOR  XR) 75 MG 24 hr capsule; Take 1 capsule (75 mg total) by mouth daily with breakfast.

## 2024-07-12 NOTE — Assessment & Plan Note (Addendum)
 Repeat A1c today.  Orders:   HgB A1c; Future

## 2024-07-12 NOTE — Assessment & Plan Note (Addendum)
 Hypertension managed with valsartan  160 mg daily, continue. Repeat CMP during her prior to follow up visit in 6 months.  Orders:   Comp Met (CMET); Future

## 2024-07-12 NOTE — Assessment & Plan Note (Addendum)
 The 10-year ASCVD risk score (Arnett DK, et al., 2019) is: 0.7%   Values used to calculate the score:     Age: 48 years     Clincally relevant sex: Female     Is Non-Hispanic African American: No     Diabetic: No     Tobacco smoker: No     Systolic Blood Pressure: 100 mmHg     Is BP treated: Yes     HDL Cholesterol: 67.6 mg/dL     Total Cholesterol: 233 mg/dL Continue healthy lifestyle modifications. Check fasting lipid panel today.  Orders:   Lipid panel; Future

## 2024-07-12 NOTE — Assessment & Plan Note (Addendum)
 Potential s/e to medication (Venlafaxine , Semaglutide ), stress. She has noted significant mood improvement on Venlafaxine  and does not want to stop taking this medication.  Recommend using Minoxidil 5% BID, if continues to notice hair fall recommend referral to dermatology for further evaluation. Use gloves and wash hands thoroughly after application.

## 2024-07-12 NOTE — Assessment & Plan Note (Addendum)
 Hepatitis C screening lab ordered.  Orders:   Hepatitis C Antibody

## 2024-07-13 LAB — HEPATITIS C ANTIBODY: Hepatitis C Ab: NONREACTIVE

## 2024-10-05 ENCOUNTER — Ambulatory Visit

## 2025-01-06 ENCOUNTER — Other Ambulatory Visit

## 2025-01-10 ENCOUNTER — Ambulatory Visit
